# Patient Record
Sex: Female | Born: 1980 | Race: White | Hispanic: No | Marital: Single | Smoking: Current every day smoker
Health system: Southern US, Community
[De-identification: ages and names within clinical notes are randomized; demographics above are authoritative.]

## PROBLEM LIST (undated history)

## (undated) DIAGNOSIS — K2100 Gastro-esophageal reflux disease with esophagitis, without bleeding: Secondary | ICD-10-CM

## (undated) DIAGNOSIS — F419 Anxiety disorder, unspecified: Secondary | ICD-10-CM

## (undated) DIAGNOSIS — K429 Umbilical hernia without obstruction or gangrene: Secondary | ICD-10-CM

## (undated) DIAGNOSIS — E119 Type 2 diabetes mellitus without complications: Secondary | ICD-10-CM

## (undated) DIAGNOSIS — M549 Dorsalgia, unspecified: Secondary | ICD-10-CM

## (undated) DIAGNOSIS — G8929 Other chronic pain: Secondary | ICD-10-CM

## (undated) HISTORY — PX: HERNIA REPAIR: SHX51

## (undated) HISTORY — PX: MYRINGOTOMY WITH TUBE PLACEMENT: SHX5663

## (undated) HISTORY — PX: ABDOMINAL HYSTERECTOMY: SHX81

## (undated) HISTORY — PX: TUBAL LIGATION: SHX77

## (undated) HISTORY — PX: BACK SURGERY: SHX140

---

## 2012-10-29 ENCOUNTER — Emergency Department (HOSPITAL_BASED_OUTPATIENT_CLINIC_OR_DEPARTMENT_OTHER)
Admission: EM | Admit: 2012-10-29 | Discharge: 2012-10-29 | Disposition: A | Discharge status: Home / Self Care | Payer: Medicaid Other | Attending: Emergency Medicine | Admitting: Emergency Medicine

## 2012-10-29 ENCOUNTER — Emergency Department (HOSPITAL_BASED_OUTPATIENT_CLINIC_OR_DEPARTMENT_OTHER): Payer: Medicaid Other

## 2012-10-29 ENCOUNTER — Encounter (HOSPITAL_BASED_OUTPATIENT_CLINIC_OR_DEPARTMENT_OTHER): Payer: Self-pay | Admitting: *Deleted

## 2012-10-29 DIAGNOSIS — Z8781 Personal history of (healed) traumatic fracture: Secondary | ICD-10-CM | POA: Insufficient documentation

## 2012-10-29 DIAGNOSIS — M25579 Pain in unspecified ankle and joints of unspecified foot: Secondary | ICD-10-CM | POA: Insufficient documentation

## 2012-10-29 DIAGNOSIS — F17200 Nicotine dependence, unspecified, uncomplicated: Secondary | ICD-10-CM | POA: Insufficient documentation

## 2012-10-29 DIAGNOSIS — M25571 Pain in right ankle and joints of right foot: Secondary | ICD-10-CM

## 2012-10-29 NOTE — ED Notes (Signed)
Patient transported to X-ray 

## 2012-10-29 NOTE — ED Provider Notes (Signed)
   History    CSN: [id number] Arrival date & time 10/29/12  2029  First MD Initiated Contact with Patient 10/29/12 2107     Chief Complaint  Patient presents with  . Ankle Pain   (Consider location/radiation/quality/duration/timing/severity/associated sxs/prior Treatment) HPI  Patient with pain right lateral ankle with history of right ankle fracture two years ago.  Patient without new injury.  Symptoms started today.  She is post partum but denies history of dvt, for leg swelling, chest pain or shortness of breath. The pain is localized over the lateral ankle. It is sharp and increases with palpation or weightbearing. It doesn't radiate slightly anteriorly. She has taken ibuprofen without relief. History reviewed. No pertinent past medical history. History reviewed. No pertinent past surgical history. No family history on file. History  Substance Use Topics  . Smoking status: Current Every Day Smoker -- 0.50 packs/day    Types: Cigarettes  . Smokeless tobacco: Not on file  . Alcohol Use: No   OB History   Grav Para Term Preterm Abortions TAB SAB Ect Mult Living                 Review of Systems  All other systems reviewed and are negative.    Allergies  Review of patient's allergies indicates no known allergies.  Home Medications  No current outpatient prescriptions on file. BP 124/79  Pulse 82  Temp(Src) 98.6 F (37 C) (Oral)  Resp 18  Ht 5\' 1"  (1.549 m)  Wt 162 lb (73.483 kg)  BMI 30.63 kg/m2  SpO2 97% Physical Exam  Nursing note and vitals reviewed. Constitutional: She appears well-developed and well-nourished.  HENT:  Head: Normocephalic and atraumatic.  Eyes: Pupils are equal, round, and reactive to light.  Neck: Normal range of motion.  Musculoskeletal:       Feet:    ED Course  Procedures (including critical care time) Labs Reviewed - No data to display Dg Ankle Complete Right  10/29/2012   *RADIOLOGY REPORT*  Clinical Data: Acute onset right  ankle pain.  RIGHT ANKLE - COMPLETE 3+ VIEW  Comparison: None.  Findings: Imaged bones, joints and soft tissues appear normal.  IMPRESSION: Negative exam.   Original Report Authenticated By: Holley Dexter, M.D.   No diagnosis found.  MDM  X-rays reviewed and no acute abnormality noted. I feel this is likely musculoskeletal as she is tender directly over her lateral malleolus. Radiographs reviewed reveal probable old healing fracture. She is placed in an Ace wrap and advised to ice and elevate and use crutches. She is given referral to followup with Dr. Pearletha Forge.  Hilario Quarry, MD 10/29/12 2125

## 2012-10-29 NOTE — ED Notes (Signed)
D/c home- no new rx given- ice pack provided for home use

## 2012-10-29 NOTE — ED Notes (Signed)
Right ankle swelling and pain. No known injury.

## 2012-11-08 ENCOUNTER — Emergency Department (HOSPITAL_BASED_OUTPATIENT_CLINIC_OR_DEPARTMENT_OTHER)
Admission: EM | Admit: 2012-11-08 | Discharge: 2012-11-08 | Disposition: A | Discharge status: Home / Self Care | Payer: PRIVATE HEALTH INSURANCE | Attending: Emergency Medicine | Admitting: Emergency Medicine

## 2012-11-08 ENCOUNTER — Encounter (HOSPITAL_BASED_OUTPATIENT_CLINIC_OR_DEPARTMENT_OTHER): Payer: Self-pay | Admitting: Emergency Medicine

## 2012-11-08 DIAGNOSIS — M5380 Other specified dorsopathies, site unspecified: Secondary | ICD-10-CM | POA: Insufficient documentation

## 2012-11-08 DIAGNOSIS — M62830 Muscle spasm of back: Secondary | ICD-10-CM

## 2012-11-08 DIAGNOSIS — F17200 Nicotine dependence, unspecified, uncomplicated: Secondary | ICD-10-CM | POA: Insufficient documentation

## 2012-11-08 DIAGNOSIS — M546 Pain in thoracic spine: Secondary | ICD-10-CM | POA: Insufficient documentation

## 2012-11-08 HISTORY — DX: Dorsalgia, unspecified: M54.9

## 2012-11-08 MED ORDER — TRAMADOL HCL 50 MG PO TABS: 50.0000 mg | ORAL_TABLET | Freq: Four times a day (QID) | ORAL | Status: DC | PRN

## 2012-11-08 MED ORDER — METHOCARBAMOL 500 MG PO TABS: 500.0000 mg | ORAL_TABLET | Freq: Two times a day (BID) | ORAL | Status: DC

## 2012-11-08 NOTE — ED Provider Notes (Signed)
   History    CSN: [id number] Arrival date & time 11/08/12  1354  First MD Initiated Contact with Patient 11/08/12 1435     Chief Complaint  Patient presents with  . Back Pain   (Consider location/radiation/quality/duration/timing/severity/associated sxs/prior Treatment) Patient is a 32 y.o. female presenting with back pain. The history is provided by the patient. No language interpreter was used.  Back Pain Location:  Lumbar spine and thoracic spine Quality:  Aching Pain severity:  Moderate Pain is:  Same all the time Onset quality:  Gradual Timing:  Constant Progression:  Worsening Chronicity:  Recurrent Relieved by:  Nothing Worsened by:  Nothing tried Pt reports she has had back pain since baby was born [birth year] .  Pt thinks she is having spasms.    Past Medical History  Diagnosis Date  . Back pain    History reviewed. No pertinent past surgical history. History reviewed. No pertinent family history. History  Substance Use Topics  . Smoking status: Current Every Day Smoker -- 0.50 packs/day    Types: Cigarettes  . Smokeless tobacco: Not on file  . Alcohol Use: No   OB History   Grav Para Term Preterm Abortions TAB SAB Ect Mult Living                 Review of Systems  Musculoskeletal: Positive for back pain.  All other systems reviewed and are negative.    Allergies  Review of patient's allergies indicates no known allergies.  Home Medications   Current Outpatient Rx  Name  Route  Sig  Dispense  Refill  . IBUPROFEN PO   Oral   Take by mouth.         . naproxen sodium (ANAPROX) 220 MG tablet   Oral   Take 220 mg by mouth as needed.          BP 128/67  Pulse 75  Temp(Src) 97.9 F (36.6 C) (Oral)  Resp 16  SpO2 99% Physical Exam  Nursing note and vitals reviewed. Constitutional: She appears well-developed and well-nourished.  HENT:  Head: Normocephalic and atraumatic.  Eyes: Conjunctivae and EOM are normal. Pupils are equal, round, and  reactive to light.  Neck: Normal range of motion. Neck supple.  Cardiovascular: Normal rate.   Pulmonary/Chest: Effort normal.  Abdominal: Soft.  Musculoskeletal:  Tender lower thoracic and lumbar spine  Neurological: She is alert.  Skin: Skin is warm.  Psychiatric: She has a normal mood and affect.    ED Course  Procedures (including critical care time) Labs Reviewed - No data to display No results found. 1. Muscle spasm of back     MDM  Pt referred to Dr. Pearletha Forge,  rx for ultram and robaxin.   I advised exercises  Elson Areas, PA-C 11/08/12 1504

## 2012-11-08 NOTE — ED Notes (Signed)
Pt c/o lower back pain since giving birth on 09/22/12; progressively getting worse

## 2012-11-08 NOTE — ED Provider Notes (Signed)
Medical screening examination/treatment/procedure(s) were performed by non-physician practitioner and as supervising physician I was immediately available for consultation/collaboration.   Rolan Bucco, MD 11/08/12 1520

## 2013-01-31 ENCOUNTER — Emergency Department (HOSPITAL_BASED_OUTPATIENT_CLINIC_OR_DEPARTMENT_OTHER)
Admission: EM | Admit: 2013-01-31 | Discharge: 2013-01-31 | Disposition: A | Discharge status: Home / Self Care | Payer: PRIVATE HEALTH INSURANCE | Attending: Emergency Medicine | Admitting: Emergency Medicine

## 2013-01-31 ENCOUNTER — Emergency Department (HOSPITAL_BASED_OUTPATIENT_CLINIC_OR_DEPARTMENT_OTHER): Payer: PRIVATE HEALTH INSURANCE

## 2013-01-31 ENCOUNTER — Encounter (HOSPITAL_BASED_OUTPATIENT_CLINIC_OR_DEPARTMENT_OTHER): Payer: Self-pay | Admitting: *Deleted

## 2013-01-31 DIAGNOSIS — F411 Generalized anxiety disorder: Secondary | ICD-10-CM | POA: Insufficient documentation

## 2013-01-31 DIAGNOSIS — Z79899 Other long term (current) drug therapy: Secondary | ICD-10-CM | POA: Insufficient documentation

## 2013-01-31 DIAGNOSIS — F17200 Nicotine dependence, unspecified, uncomplicated: Secondary | ICD-10-CM | POA: Insufficient documentation

## 2013-01-31 DIAGNOSIS — R110 Nausea: Secondary | ICD-10-CM | POA: Insufficient documentation

## 2013-01-31 DIAGNOSIS — R51 Headache: Secondary | ICD-10-CM | POA: Insufficient documentation

## 2013-01-31 DIAGNOSIS — R42 Dizziness and giddiness: Secondary | ICD-10-CM | POA: Insufficient documentation

## 2013-01-31 DIAGNOSIS — Z3202 Encounter for pregnancy test, result negative: Secondary | ICD-10-CM | POA: Insufficient documentation

## 2013-01-31 LAB — COMPREHENSIVE METABOLIC PANEL
AST: 14 U/L (ref 0–37)
Albumin: 4.1 g/dL (ref 3.5–5.2)
Calcium: 9.7 mg/dL (ref 8.4–10.5)
Creatinine, Ser: 0.7 mg/dL (ref 0.50–1.10)
GFR calc non Af Amer: >90 mL/min (ref >90)
Total Protein: 7.7 g/dL (ref 6.0–8.3)

## 2013-01-31 LAB — URINALYSIS, ROUTINE W REFLEX MICROSCOPIC
Bilirubin Urine: NEGATIVE
Hgb urine dipstick: NEGATIVE
Nitrite: NEGATIVE
Protein, ur: NEGATIVE mg/dL
Urobilinogen, UA: 0.2 mg/dL (ref 0.0–1.0)

## 2013-01-31 LAB — CBC WITH DIFFERENTIAL/PLATELET
Basophils Absolute: 0 10*3/uL (ref 0.0–0.1)
Basophils Relative: 0 % (ref 0–1)
Eosinophils Absolute: 0.1 10*3/uL (ref 0.0–0.7)
Eosinophils Relative: 1 % (ref 0–5)
HCT: 39.7 % (ref 36.0–46.0)
MCHC: 34 g/dL (ref 30.0–36.0)
MCV: 87.8 fL (ref 78.0–100.0)
Monocytes Absolute: 0.4 10*3/uL (ref 0.1–1.0)
Platelets: 241 10*3/uL (ref 150–400)
RDW: 13.9 % (ref 11.5–15.5)

## 2013-01-31 MED ORDER — ONDANSETRON HCL 4 MG/2ML IJ SOLN
4.0000 mg | Freq: Once | INTRAMUSCULAR | Status: AC
  Administered 2013-01-31: 4 mg via INTRAVENOUS
  Filled 2013-01-31: qty 2

## 2013-01-31 MED ORDER — MECLIZINE HCL 25 MG PO TABS
25.0000 mg | ORAL_TABLET | Freq: Once | ORAL | Status: AC
  Administered 2013-01-31: 25 mg via ORAL
  Filled 2013-01-31: qty 1

## 2013-01-31 MED ORDER — SODIUM CHLORIDE 0.9 % IV BOLUS (SEPSIS)
1000.0000 mL | Freq: Once | INTRAVENOUS | Status: AC
  Administered 2013-01-31: 1000 mL via INTRAVENOUS

## 2013-01-31 MED ORDER — MECLIZINE HCL 12.5 MG PO TABS: 12.5000 mg | ORAL_TABLET | Freq: Three times a day (TID) | ORAL | Status: DC | PRN

## 2013-01-31 MED ORDER — DIAZEPAM 2 MG PO TABS: 2.0000 mg | ORAL_TABLET | Freq: Three times a day (TID) | ORAL | Status: DC | PRN

## 2013-01-31 MED ORDER — ONDANSETRON HCL 4 MG PO TABS: 4.0000 mg | ORAL_TABLET | Freq: Four times a day (QID) | ORAL | Status: DC

## 2013-01-31 MED ORDER — LORAZEPAM 1 MG PO TABS
1.0000 mg | ORAL_TABLET | Freq: Once | ORAL | Status: AC
  Administered 2013-01-31: 1 mg via ORAL
  Filled 2013-01-31: qty 1

## 2013-01-31 NOTE — ED Notes (Signed)
Pt ambulated around department without difficulty. No complications noted. Pt states "I still feel a little dizzy but much better."

## 2013-01-31 NOTE — ED Provider Notes (Signed)
CSN: [id number]     Arrival date & time 01/31/13  1455 History  This chart was scribed for Glynn Octave, MD by Ardelia Mems, ED Scribe. This patient was seen in room MH10/MH10 and the patient's care was started at 3:11 PM.  Chief Complaint  Patient presents with  . Dizziness    The history is provided by the patient. No language interpreter was used.    HPI Comments: Maria French is a 32 y.o. female who presents to the Emergency Department complaining of intemittent dizziness over the past 4 months which worsened 3 days ago. She states that she has been having intermittent "dizzy spells" since she gave birth about 4 months ago. She describes her dizziness as a room spinning sensation. She states that her episodes of dizziness are normally only minutes, but she states that today episodes have been much longer, and that her dizziness is almost constant. She reports associated nausea and a generalized headache. She also states that she was recently diagnosed with anxiety after having her child, and she states that she was at first prescribed Zoloft, but is no longer taking this medication because she believed it made her anxiety worse. She states that she believes her dizziness over the past 4 months is caused by anxiety due to the birth of her child, and she has been taking Meclizine some days with mild relief of her dizziness. She states that she has not taken Meclizine today. She states that she is not taking birth control, and reports a history of tubal ligation. She states that she has no allergies to medications. She denies blurred vision, emesis, abdominal pain, chest pain or any other symptoms.   Past Medical History  Diagnosis Date  . Back pain    Past Surgical History  Procedure Laterality Date  . Tubal ligation     History reviewed. No pertinent family history. History  Substance Use Topics  . Smoking status: Current Every Day Smoker -- 0.50 packs/day    Types: Cigarettes  .  Smokeless tobacco: Not on file  . Alcohol Use: No   OB History   Grav Para Term Preterm Abortions TAB SAB Ect Mult Living                 Review of Systems A complete 10 system review of systems was obtained and all systems are negative except as noted in the HPI and PMH.   Allergies  Review of patient's allergies indicates no known allergies.  Home Medications   Current Outpatient Rx  Name  Route  Sig  Dispense  Refill  . diazepam (VALIUM) 2 MG tablet   Oral   Take 1 tablet (2 mg total) by mouth every 8 (eight) hours as needed (dizziness).   10 tablet   0   . IBUPROFEN PO   Oral   Take by mouth.         . meclizine (ANTIVERT) 12.5 MG tablet   Oral   Take 1 tablet (12.5 mg total) by mouth 3 (three) times daily as needed.   30 tablet   0   . methocarbamol (ROBAXIN) 500 MG tablet   Oral   Take 1 tablet (500 mg total) by mouth 2 (two) times daily.   20 tablet   0   . naproxen sodium (ANAPROX) 220 MG tablet   Oral   Take 220 mg by mouth as needed.         . ondansetron (ZOFRAN) 4 MG tablet  Oral   Take 1 tablet (4 mg total) by mouth every 6 (six) hours.   12 tablet   0   . traMADol (ULTRAM) 50 MG tablet   Oral   Take 1 tablet (50 mg total) by mouth every 6 (six) hours as needed for pain.   20 tablet   0    Triage Vitals: BP 130/81  Pulse 77  Temp(Src) 98.4 F (36.9 C) (Oral)  Resp 16  Ht 5\' 1"  (1.549 m)  Wt 172 lb (78.019 kg)  BMI 32.52 kg/m2  SpO2 99%  Physical Exam  Nursing note and vitals reviewed. Constitutional: She is oriented to person, place, and time. She appears well-developed and well-nourished. No distress.  HENT:  Head: Normocephalic and atraumatic.  Mouth/Throat: Oropharynx is clear and moist. No oropharyngeal exudate.  Eyes: EOM are normal. Pupils are equal, round, and reactive to light.  Neck: Normal range of motion. Neck supple. No tracheal deviation present.  Cardiovascular: Normal rate.   No murmur  heard. Pulmonary/Chest: Effort normal. No respiratory distress.  Abdominal: Soft. There is no tenderness. There is no rebound and no guarding.  Musculoskeletal: Normal range of motion. She exhibits no edema and no tenderness.  Neurological: She is alert and oriented to person, place, and time. No cranial nerve deficit. She exhibits normal muscle tone. Coordination normal.  CN 2-12 intact, no ataxia on finger to nose, no nystagmus, 5/5 strength throughout, no pronator drift, Romberg positive, normal gait.  Test of skew negative. Head impulse testing negative.  Skin: Skin is warm and dry.  Psychiatric: She has a normal mood and affect. Her behavior is normal.    ED Course  Procedures (including critical care time)  DIAGNOSTIC STUDIES: Oxygen Saturation is 99% on RA, normal by my interpretation.    COORDINATION OF CARE: 3:17 PM- Discussed plan for pt to receive IV fluids, Zofran, Ativan and Antivert in the ED. Also discussed plan to obtain diagnostic lab work and a CT of pt's head. Discussed that a stroke or more serious causes of pt's dizziness are not suspected. Pt advised of plan for treatment and pt agrees.  4:27 PM- Recheck with pt and pt states that she is feeling better, with her dizziness mostly subsided, after receiving Ativan and Antivert in the ED. Discussed suspicion of positional vertigo. Discussed plan for pt to attempt drinking fluids and ambulating in the ED. Discussed plan for discharge with a prescription for some Ativan and pt agrees.  Medications  sodium chloride 0.9 % bolus 1,000 mL (0 mLs Intravenous Stopped 01/31/13 1701)  ondansetron (ZOFRAN) injection 4 mg (4 mg Intravenous Given 01/31/13 1538)  meclizine (ANTIVERT) tablet 25 mg (25 mg Oral Given 01/31/13 1538)  LORazepam (ATIVAN) tablet 1 mg (1 mg Oral Given 01/31/13 1538)   Labs Review Labs Reviewed  COMPREHENSIVE METABOLIC PANEL - Abnormal; Notable for the following:    Total Bilirubin 0.2 (*)    All other  components within normal limits  URINALYSIS, ROUTINE W REFLEX MICROSCOPIC  PREGNANCY, URINE  CBC WITH DIFFERENTIAL   Imaging Review Ct Head Wo Contrast  01/31/2013   CLINICAL DATA:  Dizziness and headache  EXAM: CT HEAD WITHOUT CONTRAST  TECHNIQUE: Contiguous axial images were obtained from the base of the skull through the vertex without intravenous contrast.  COMPARISON:  None.  FINDINGS: The bony calvarium is intact. The ventricles are of normal size and configuration. No findings to suggest acute hemorrhage, acute infarction or space-occupying mass lesion are noted. Marland Kitchen  IMPRESSION:  No acute abnormality is identified.   Electronically Signed   By: Alcide Clever M.D.   On: 01/31/2013 15:58    MDM   1. Vertigo    Intermittent vertigo since giving birth 4 months ago, worse today. PCP attributes to anxiety. No focal weakness, numbness or tingling.  Romberg positive, otherwise neurologically intact. Normal gait. No nystagmus. Negative head impulse testing and test of skew. CT head negative. Labs unremarkable. Symptoms improved with Ativan and meclizine. Tolerating by mouth and ambulatory. Do not suspect CNS pathology. She appears stable for outpatient followup with her doctor and neurology.   Date: 01/31/2013  Rate: 74  Rhythm: normal sinus rhythm  QRS Axis: normal  Intervals: normal  ST/T Wave abnormalities: normal  Conduction Disutrbances:none  Narrative Interpretation:   Old EKG Reviewed: none available    I personally performed the services described in this documentation, which was scribed in my presence. The recorded information has been reviewed and is accurate.           Glynn Octave, MD 01/31/13 [phone]

## 2013-01-31 NOTE — ED Notes (Signed)
Pt c/o dizziness x 3 days, already taking antivert

## 2013-01-31 NOTE — ED Notes (Signed)
MD at bedside. 

## 2013-03-22 ENCOUNTER — Other Ambulatory Visit: Payer: Self-pay | Admitting: Neurosurgery

## 2013-03-22 DIAGNOSIS — M545 Low back pain: Secondary | ICD-10-CM

## 2013-03-22 DIAGNOSIS — M5430 Sciatica, unspecified side: Secondary | ICD-10-CM

## 2013-04-02 ENCOUNTER — Ambulatory Visit
Admission: RE | Admit: 2013-04-02 | Discharge: 2013-04-02 | Disposition: A | Discharge status: Home / Self Care | Payer: PRIVATE HEALTH INSURANCE | Source: Ambulatory Visit | Attending: Neurosurgery | Admitting: Neurosurgery

## 2013-04-02 VITALS — BP 106/71 | HR 82

## 2013-04-02 DIAGNOSIS — M545 Low back pain, unspecified: Secondary | ICD-10-CM

## 2013-04-02 DIAGNOSIS — M5430 Sciatica, unspecified side: Secondary | ICD-10-CM

## 2013-04-02 MED ORDER — HYDROMORPHONE HCL PF 1 MG/ML IJ SOLN
1.0000 mg | Freq: Once | INTRAMUSCULAR | Status: AC
  Administered 2013-04-02: 1 mg via INTRAMUSCULAR

## 2013-04-02 MED ORDER — IOHEXOL 180 MG/ML  SOLN
18.0000 mL | Freq: Once | INTRAMUSCULAR | Status: AC | PRN
  Administered 2013-04-02: 18 mL via INTRATHECAL

## 2013-04-02 MED ORDER — ONDANSETRON HCL 4 MG/2ML IJ SOLN
4.0000 mg | Freq: Once | INTRAMUSCULAR | Status: AC
  Administered 2013-04-02: 4 mg via INTRAMUSCULAR

## 2013-04-02 NOTE — Progress Notes (Signed)
Patient states last dose of Tramadol was two days ago.  jkl

## 2013-05-26 ENCOUNTER — Encounter (HOSPITAL_COMMUNITY): Payer: Self-pay | Admitting: Pharmacy Technician

## 2013-05-26 ENCOUNTER — Other Ambulatory Visit: Payer: Self-pay | Admitting: Neurosurgery

## 2013-05-31 ENCOUNTER — Inpatient Hospital Stay (HOSPITAL_COMMUNITY): Admission: RE | Admit: 2013-05-31 | Payer: PRIVATE HEALTH INSURANCE | Source: Ambulatory Visit | Admitting: Neurosurgery

## 2013-05-31 ENCOUNTER — Encounter (HOSPITAL_COMMUNITY): Admission: RE | Payer: Self-pay | Source: Ambulatory Visit

## 2013-05-31 SURGERY — POSTERIOR LUMBAR FUSION 1 LEVEL
Anesthesia: General | Site: Back

## 2013-06-03 ENCOUNTER — Ambulatory Visit (HOSPITAL_COMMUNITY)
Admission: RE | Admit: 2013-06-03 | Discharge: 2013-06-03 | Disposition: A | Discharge status: Home / Self Care | Payer: BC Managed Care – PPO | Source: Ambulatory Visit | Attending: Neurosurgery | Admitting: Neurosurgery

## 2013-06-03 ENCOUNTER — Encounter (HOSPITAL_COMMUNITY): Payer: Self-pay

## 2013-06-03 ENCOUNTER — Encounter (HOSPITAL_COMMUNITY)
Admission: RE | Admit: 2013-06-03 | Discharge: 2013-06-03 | Disposition: A | Discharge status: Home / Self Care | Payer: BC Managed Care – PPO | Source: Ambulatory Visit | Attending: Neurosurgery | Admitting: Neurosurgery

## 2013-06-03 DIAGNOSIS — Z01812 Encounter for preprocedural laboratory examination: Secondary | ICD-10-CM | POA: Insufficient documentation

## 2013-06-03 DIAGNOSIS — M47817 Spondylosis without myelopathy or radiculopathy, lumbosacral region: Secondary | ICD-10-CM | POA: Insufficient documentation

## 2013-06-03 DIAGNOSIS — G4730 Sleep apnea, unspecified: Secondary | ICD-10-CM | POA: Insufficient documentation

## 2013-06-03 DIAGNOSIS — Z87891 Personal history of nicotine dependence: Secondary | ICD-10-CM | POA: Insufficient documentation

## 2013-06-03 LAB — BASIC METABOLIC PANEL
BUN: 12 mg/dL (ref 6–23)
CHLORIDE: 101 meq/L (ref 96–112)
CO2: 26 meq/L (ref 19–32)
Calcium: 8.9 mg/dL (ref 8.4–10.5)
Creatinine, Ser: 0.63 mg/dL (ref 0.50–1.10)
GFR calc Af Amer: >90 mL/min (ref >90)
GLUCOSE: 104 mg/dL — AB (ref 70–99)
POTASSIUM: 3.7 meq/L (ref 3.7–5.3)
Sodium: 140 mEq/L (ref 137–147)

## 2013-06-03 LAB — CBC
HEMATOCRIT: 39 % (ref 36.0–46.0)
HEMOGLOBIN: 13.8 g/dL (ref 12.0–15.0)
MCH: 31.4 pg (ref 26.0–34.0)
MCHC: 35.4 g/dL (ref 30.0–36.0)
MCV: 88.6 fL (ref 78.0–100.0)
Platelets: 211 10*3/uL (ref 150–400)
RBC: 4.4 MIL/uL (ref 3.87–5.11)
RDW: 13.2 % (ref 11.5–15.5)
WBC: 7.6 10*3/uL (ref 4.0–10.5)

## 2013-06-03 LAB — SURGICAL PCR SCREEN
MRSA, PCR: NEGATIVE
Staphylococcus aureus: POSITIVE — AB

## 2013-06-03 LAB — TYPE AND SCREEN
ABO/RH(D): O POS
Antibody Screen: NEGATIVE

## 2013-06-03 LAB — ABO/RH: ABO/RH(D): O POS

## 2013-06-03 LAB — HCG, SERUM, QUALITATIVE: Preg, Serum: NEGATIVE

## 2013-06-03 NOTE — Pre-Procedure Instructions (Signed)
Maria French  06/03/2013   Your procedure is scheduled on:  Wednesday February 11 th at 1025 AM  Report to [facility] Short Stay Main Entrance "A"at 0825 AM.  Call this number if you have problems the morning of surgery: [phone]   Remember:   Do not eat food or drink liquids after midnight.   Take these medicines the morning of surgery with A SIP OF WATER: Klonopin, Pain med if needed for pain.  Stop Aspirin, Vitamins, herbal medication, and NSAIDs (ibuprofen, Advil, Aleve and Naproxen)  Do not wear jewelry, make-up or nail polish.  Do not wear lotions, powders, or perfumes. You may wear deodorant.  Do not shave 48 hours prior to surgery.  Do not bring valuables to the hospital.  [facility] is not responsible  For any belongings or valuables.                               Contacts, dentures or bridgework may not be worn into surgery.  Leave suitcase in the car. After surgery it may be brought to your room.  For patients admitted to the hospital, discharge time is determined by your treatment team.               Patients discharged the day of surgery will not be allowed to drive  home.    Special Instructions: [place] - Preparing for Surgery  Before surgery, you can play an important role.  Because skin is not sterile, your skin needs to be as free of germs as possible.  You can reduce the number of germs on you skin by washing with CHG (chlorahexidine gluconate) soap before surgery.  CHG is an antiseptic cleaner which kills germs and bonds with the skin to continue killing germs even after washing.  Please DO NOT use if you have an allergy to CHG or antibacterial soaps.  If your skin becomes reddened/irritated stop using the CHG and inform your nurse when you arrive at Short Stay.  Do not shave (including legs and underarms) for at least 48 hours prior to the first CHG shower.  You may shave your face.  Please follow these instructions carefully:   1.  Shower with CHG  Soap the night before surgery and the  morning of Surgery.  2.  If you choose to wash your hair, wash your hair first as usual with your normal shampoo.  3.  After you shampoo, rinse your hair and body thoroughly to remove the  Shampoo.  4.  Use CHG as you would any other liquid soap.  You can apply chg directly  to the skin and wash gently with scrungie or a clean washcloth.  5.  Apply the CHG Soap to your body ONLY FROM THE NECK DOWN.  Do not use on open wounds or open sores.  Avoid contact with your eyes,       ears, mouth and genitals (private parts).  Wash genitals (private parts) with your normal soap.  6.  Wash thoroughly, paying special attention to the area where your surgery   will be performed.  7.  Thoroughly rinse your body with warm water from the neck down.  8.  DO NOT shower/wash with your normal soap after using and rinsing off the CHG Soap.  9.  Pat yourself dry with a clean towel.            10.  Wear clean  pajamas.            11.  Place clean sheets on your bed the night of your first shower and do not sleep with pets.  Day of Surgery  Do not apply any lotions/deoderants the morning of surgery.  Please wear clean clothes to the hospital/surgery center.      Please read over the following fact sheets that you were given: Pain Booklet, Coughing and Deep Breathing, Blood Transfusion Information, MRSA Information and Surgical Site Infection Prevention

## 2013-06-07 NOTE — Progress Notes (Signed)
Call placed to [facility] in Medical records at [place] to request sleep study report, no answer when call transferred to medical records.

## 2013-06-08 MED ORDER — DEXAMETHASONE SODIUM PHOSPHATE 10 MG/ML IJ SOLN
10.0000 mg | INTRAMUSCULAR | Status: AC
  Administered 2013-06-09: 10 mg via INTRAVENOUS
  Filled 2013-06-08: qty 1

## 2013-06-08 MED ORDER — CEFAZOLIN SODIUM-DEXTROSE 2-3 GM-% IV SOLR
2.0000 g | INTRAVENOUS | Status: AC
  Administered 2013-06-09: 2 g via INTRAVENOUS

## 2013-06-08 NOTE — Progress Notes (Signed)
Pt notified of time change;to arrive at [facility]

## 2013-06-09 ENCOUNTER — Encounter (HOSPITAL_COMMUNITY): Payer: BC Managed Care – PPO | Admitting: Anesthesiology

## 2013-06-09 ENCOUNTER — Encounter (HOSPITAL_COMMUNITY)
Admission: RE | Disposition: A | Discharge status: Home / Self Care | Payer: Self-pay | Source: Ambulatory Visit | Attending: Neurosurgery

## 2013-06-09 ENCOUNTER — Encounter (HOSPITAL_COMMUNITY): Payer: Self-pay | Admitting: *Deleted

## 2013-06-09 ENCOUNTER — Inpatient Hospital Stay (HOSPITAL_COMMUNITY): Payer: BC Managed Care – PPO

## 2013-06-09 ENCOUNTER — Inpatient Hospital Stay (HOSPITAL_COMMUNITY)
Admission: RE | Admit: 2013-06-09 | Discharge: 2013-06-11 | DRG: 460 | Disposition: A | Discharge status: Home / Self Care | Payer: BC Managed Care – PPO | Source: Ambulatory Visit | Attending: Neurosurgery | Admitting: Neurosurgery

## 2013-06-09 ENCOUNTER — Inpatient Hospital Stay (HOSPITAL_COMMUNITY): Payer: BC Managed Care – PPO | Admitting: Anesthesiology

## 2013-06-09 DIAGNOSIS — M5137 Other intervertebral disc degeneration, lumbosacral region: Principal | ICD-10-CM | POA: Diagnosis present

## 2013-06-09 DIAGNOSIS — M47817 Spondylosis without myelopathy or radiculopathy, lumbosacral region: Secondary | ICD-10-CM | POA: Diagnosis present

## 2013-06-09 DIAGNOSIS — M48061 Spinal stenosis, lumbar region without neurogenic claudication: Secondary | ICD-10-CM | POA: Diagnosis present

## 2013-06-09 DIAGNOSIS — F17200 Nicotine dependence, unspecified, uncomplicated: Secondary | ICD-10-CM | POA: Diagnosis present

## 2013-06-09 DIAGNOSIS — M7130 Other bursal cyst, unspecified site: Secondary | ICD-10-CM | POA: Diagnosis present

## 2013-06-09 DIAGNOSIS — M51379 Other intervertebral disc degeneration, lumbosacral region without mention of lumbar back pain or lower extremity pain: Principal | ICD-10-CM | POA: Diagnosis present

## 2013-06-09 LAB — GLUCOSE, CAPILLARY: GLUCOSE-CAPILLARY: 235 mg/dL — AB (ref 70–99)

## 2013-06-09 SURGERY — POSTERIOR LUMBAR FUSION 1 LEVEL
Anesthesia: General | Site: Back

## 2013-06-09 MED ORDER — ONDANSETRON HCL 4 MG/2ML IJ SOLN: 4.0000 mg | Freq: Once | INTRAMUSCULAR | Status: DC | PRN

## 2013-06-09 MED ORDER — SODIUM CHLORIDE 0.9 % IJ SOLN: 3.0000 mL | INTRAMUSCULAR | Status: DC | PRN

## 2013-06-09 MED ORDER — LACTATED RINGERS IV SOLN
INTRAVENOUS | Status: DC | PRN
  Administered 2013-06-09 (×3): via INTRAVENOUS

## 2013-06-09 MED ORDER — HYDROMORPHONE HCL PF 1 MG/ML IJ SOLN
INTRAMUSCULAR | Status: AC
  Filled 2013-06-09: qty 1

## 2013-06-09 MED ORDER — FENTANYL CITRATE 0.05 MG/ML IJ SOLN
INTRAMUSCULAR | Status: DC | PRN
  Administered 2013-06-09: 100 ug via INTRAVENOUS
  Administered 2013-06-09: 50 ug via INTRAVENOUS

## 2013-06-09 MED ORDER — ACETAMINOPHEN 650 MG RE SUPP: 650.0000 mg | RECTAL | Status: DC | PRN

## 2013-06-09 MED ORDER — LIDOCAINE HCL (CARDIAC) 20 MG/ML IV SOLN
INTRAVENOUS | Status: DC | PRN
  Administered 2013-06-09: 80 mg via INTRAVENOUS

## 2013-06-09 MED ORDER — DOCUSATE SODIUM 100 MG PO CAPS
100.0000 mg | ORAL_CAPSULE | Freq: Two times a day (BID) | ORAL | Status: DC
  Administered 2013-06-09 – 2013-06-11 (×4): 100 mg via ORAL
  Filled 2013-06-09 (×4): qty 1

## 2013-06-09 MED ORDER — CYCLOBENZAPRINE HCL 10 MG PO TABS
10.0000 mg | ORAL_TABLET | Freq: Three times a day (TID) | ORAL | Status: DC | PRN
  Administered 2013-06-09 – 2013-06-10 (×5): 10 mg via ORAL
  Filled 2013-06-09 (×4): qty 1

## 2013-06-09 MED ORDER — NEOSTIGMINE METHYLSULFATE 1 MG/ML IJ SOLN
INTRAMUSCULAR | Status: DC | PRN
  Administered 2013-06-09: 3 mg via INTRAVENOUS

## 2013-06-09 MED ORDER — LIDOCAINE HCL (CARDIAC) 20 MG/ML IV SOLN
INTRAVENOUS | Status: AC
  Filled 2013-06-09: qty 5

## 2013-06-09 MED ORDER — NEOSTIGMINE METHYLSULFATE 1 MG/ML IJ SOLN
INTRAMUSCULAR | Status: AC
  Filled 2013-06-09: qty 10

## 2013-06-09 MED ORDER — ALUM & MAG HYDROXIDE-SIMETH 200-200-20 MG/5ML PO SUSP
30.0000 mL | Freq: Four times a day (QID) | ORAL | Status: DC | PRN
  Filled 2013-06-09: qty 30

## 2013-06-09 MED ORDER — CYCLOBENZAPRINE HCL 10 MG PO TABS
ORAL_TABLET | ORAL | Status: AC
  Filled 2013-06-09: qty 1

## 2013-06-09 MED ORDER — OXYCODONE HCL 5 MG PO TABS
ORAL_TABLET | ORAL | Status: AC
  Filled 2013-06-09: qty 1

## 2013-06-09 MED ORDER — PROPOFOL 10 MG/ML IV BOLUS
INTRAVENOUS | Status: AC
  Filled 2013-06-09: qty 20

## 2013-06-09 MED ORDER — OXYCODONE HCL 5 MG PO TABS
5.0000 mg | ORAL_TABLET | Freq: Once | ORAL | Status: AC | PRN
  Administered 2013-06-09: 5 mg via ORAL

## 2013-06-09 MED ORDER — ACETAMINOPHEN 325 MG PO TABS: 650.0000 mg | ORAL_TABLET | ORAL | Status: DC | PRN

## 2013-06-09 MED ORDER — ROCURONIUM BROMIDE 50 MG/5ML IV SOLN
INTRAVENOUS | Status: AC
  Filled 2013-06-09: qty 1

## 2013-06-09 MED ORDER — SODIUM CHLORIDE 0.9 % IR SOLN
Status: DC | PRN
  Administered 2013-06-09: 08:00:00

## 2013-06-09 MED ORDER — BUPIVACAINE HCL (PF) 0.25 % IJ SOLN
INTRAMUSCULAR | Status: DC | PRN
  Administered 2013-06-09: 10 mL

## 2013-06-09 MED ORDER — SODIUM CHLORIDE 0.9 % IJ SOLN
3.0000 mL | Freq: Two times a day (BID) | INTRAMUSCULAR | Status: DC
  Administered 2013-06-10 (×2): 3 mL via INTRAVENOUS

## 2013-06-09 MED ORDER — THROMBIN 20000 UNITS EX SOLR
CUTANEOUS | Status: DC | PRN
  Administered 2013-06-09: 08:00:00 via TOPICAL

## 2013-06-09 MED ORDER — PHENOL 1.4 % MT LIQD: 1.0000 | OROMUCOSAL | Status: DC | PRN

## 2013-06-09 MED ORDER — OXYCODONE HCL 5 MG/5ML PO SOLN: 5.0000 mg | Freq: Once | ORAL | Status: AC | PRN

## 2013-06-09 MED ORDER — MIDAZOLAM HCL 5 MG/5ML IJ SOLN
INTRAMUSCULAR | Status: DC | PRN
  Administered 2013-06-09: 2 mg via INTRAVENOUS

## 2013-06-09 MED ORDER — GLYCOPYRROLATE 0.2 MG/ML IJ SOLN
INTRAMUSCULAR | Status: DC | PRN
  Administered 2013-06-09: 0.4 mg via INTRAVENOUS

## 2013-06-09 MED ORDER — ACETAMINOPHEN 325 MG PO TABS: 325.0000 mg | ORAL_TABLET | ORAL | Status: DC | PRN

## 2013-06-09 MED ORDER — HYDROMORPHONE HCL PF 1 MG/ML IJ SOLN
0.5000 mg | INTRAMUSCULAR | Status: DC | PRN
  Administered 2013-06-09 – 2013-06-10 (×2): 1 mg via INTRAVENOUS
  Administered 2013-06-10: 0.5 mg via INTRAVENOUS
  Administered 2013-06-11: 1 mg via INTRAVENOUS
  Filled 2013-06-09 (×4): qty 1

## 2013-06-09 MED ORDER — HYDROMORPHONE HCL PF 1 MG/ML IJ SOLN
0.2500 mg | INTRAMUSCULAR | Status: DC | PRN
  Administered 2013-06-09 (×4): 0.5 mg via INTRAVENOUS

## 2013-06-09 MED ORDER — ONDANSETRON HCL 4 MG/2ML IJ SOLN
INTRAMUSCULAR | Status: DC | PRN
  Administered 2013-06-09: 4 mg via INTRAVENOUS

## 2013-06-09 MED ORDER — ONDANSETRON HCL 4 MG/2ML IJ SOLN
INTRAMUSCULAR | Status: AC
  Filled 2013-06-09: qty 2

## 2013-06-09 MED ORDER — KETOROLAC TROMETHAMINE 30 MG/ML IJ SOLN: 15.0000 mg | Freq: Once | INTRAMUSCULAR | Status: DC | PRN

## 2013-06-09 MED ORDER — OXYCODONE-ACETAMINOPHEN 5-325 MG PO TABS
1.0000 | ORAL_TABLET | ORAL | Status: DC | PRN
  Administered 2013-06-09 – 2013-06-11 (×9): 2 via ORAL
  Filled 2013-06-09 (×9): qty 2

## 2013-06-09 MED ORDER — GLYCOPYRROLATE 0.2 MG/ML IJ SOLN
INTRAMUSCULAR | Status: AC
Start: 2013-06-09 — End: 2013-06-09
  Filled 2013-06-09: qty 1

## 2013-06-09 MED ORDER — ALBUMIN HUMAN 5 % IV SOLN
INTRAVENOUS | Status: DC | PRN
  Administered 2013-06-09: 11:00:00 via INTRAVENOUS

## 2013-06-09 MED ORDER — 0.9 % SODIUM CHLORIDE (POUR BTL) OPTIME
TOPICAL | Status: DC | PRN
  Administered 2013-06-09: 1000 mL

## 2013-06-09 MED ORDER — ARTIFICIAL TEARS OP OINT
TOPICAL_OINTMENT | OPHTHALMIC | Status: DC | PRN
  Administered 2013-06-09: 1 via OPHTHALMIC

## 2013-06-09 MED ORDER — ARTIFICIAL TEARS OP OINT
TOPICAL_OINTMENT | OPHTHALMIC | Status: AC
  Filled 2013-06-09: qty 3.5

## 2013-06-09 MED ORDER — GLYCOPYRROLATE 0.2 MG/ML IJ SOLN
INTRAMUSCULAR | Status: AC
  Filled 2013-06-09: qty 1

## 2013-06-09 MED ORDER — FENTANYL CITRATE 0.05 MG/ML IJ SOLN
INTRAMUSCULAR | Status: AC
  Filled 2013-06-09: qty 5

## 2013-06-09 MED ORDER — LIDOCAINE-EPINEPHRINE 1 %-1:100000 IJ SOLN
INTRAMUSCULAR | Status: DC | PRN
  Administered 2013-06-09: 8 mL

## 2013-06-09 MED ORDER — MIDAZOLAM HCL 2 MG/2ML IJ SOLN
INTRAMUSCULAR | Status: AC
  Filled 2013-06-09: qty 2

## 2013-06-09 MED ORDER — TRAMADOL HCL 50 MG PO TABS
50.0000 mg | ORAL_TABLET | Freq: Four times a day (QID) | ORAL | Status: DC | PRN
  Administered 2013-06-11: 50 mg via ORAL
  Filled 2013-06-09: qty 1

## 2013-06-09 MED ORDER — PROPOFOL 10 MG/ML IV BOLUS
INTRAVENOUS | Status: DC | PRN
  Administered 2013-06-09: 200 mg via INTRAVENOUS

## 2013-06-09 MED ORDER — SODIUM CHLORIDE 0.9 % IV SOLN
250.0000 mL | INTRAVENOUS | Status: DC
  Administered 2013-06-09: 250 mL via INTRAVENOUS

## 2013-06-09 MED ORDER — CEFAZOLIN SODIUM 1-5 GM-% IV SOLN
1.0000 g | Freq: Three times a day (TID) | INTRAVENOUS | Status: AC
  Administered 2013-06-09 – 2013-06-10 (×2): 1 g via INTRAVENOUS
  Filled 2013-06-09 (×2): qty 50

## 2013-06-09 MED ORDER — ONDANSETRON HCL 4 MG/2ML IJ SOLN: 4.0000 mg | INTRAMUSCULAR | Status: DC | PRN

## 2013-06-09 MED ORDER — SODIUM CHLORIDE 0.9 % IJ SOLN
INTRAMUSCULAR | Status: AC
  Filled 2013-06-09: qty 10

## 2013-06-09 MED ORDER — CLONAZEPAM 0.5 MG PO TABS
0.2500 mg | ORAL_TABLET | Freq: Every day | ORAL | Status: DC
  Administered 2013-06-10 – 2013-06-11 (×2): 0.25 mg via ORAL
  Filled 2013-06-09 (×3): qty 1

## 2013-06-09 MED ORDER — EPHEDRINE SULFATE 50 MG/ML IJ SOLN
INTRAMUSCULAR | Status: AC
  Filled 2013-06-09: qty 1

## 2013-06-09 MED ORDER — MENTHOL 3 MG MT LOZG: 1.0000 | LOZENGE | OROMUCOSAL | Status: DC | PRN

## 2013-06-09 MED ORDER — ROCURONIUM BROMIDE 100 MG/10ML IV SOLN
INTRAVENOUS | Status: DC | PRN
  Administered 2013-06-09: 40 mg via INTRAVENOUS
  Administered 2013-06-09 (×2): 10 mg via INTRAVENOUS

## 2013-06-09 MED ORDER — ACETAMINOPHEN 160 MG/5ML PO SOLN
325.0000 mg | ORAL | Status: DC | PRN
Start: 2013-06-09 — End: 2013-06-09
  Filled 2013-06-09: qty 20.3

## 2013-06-09 SURGICAL SUPPLY — 73 items
BAG DECANTER FOR FLEXI CONT (MISCELLANEOUS) ×3 IMPLANT
BENZOIN TINCTURE PRP APPL 2/3 (GAUZE/BANDAGES/DRESSINGS) ×3 IMPLANT
BLADE SURG 11 STRL SS (BLADE) ×3 IMPLANT
BLADE SURG ROTATE 9660 (MISCELLANEOUS) IMPLANT
BRUSH SCRUB EZ PLAIN DRY (MISCELLANEOUS) ×3 IMPLANT
BUR MATCHSTICK NEURO 3.0 LAGG (BURR) ×3 IMPLANT
BUR PRECISION FLUTE 6.0 (BURR) ×3 IMPLANT
CANISTER SUCT 3000ML (MISCELLANEOUS) ×3 IMPLANT
CAP LOCKING THREADED (Cap) ×12 IMPLANT
CLOSURE WOUND 1/2 X4 (GAUZE/BANDAGES/DRESSINGS) ×2
CONT SPEC 4OZ CLIKSEAL STRL BL (MISCELLANEOUS) ×6 IMPLANT
COVER BACK TABLE 24X17X13 BIG (DRAPES) IMPLANT
COVER TABLE BACK 60X90 (DRAPES) ×3 IMPLANT
DECANTER SPIKE VIAL GLASS SM (MISCELLANEOUS) ×3 IMPLANT
DERMABOND ADHESIVE PROPEN (GAUZE/BANDAGES/DRESSINGS) ×2
DERMABOND ADVANCED (GAUZE/BANDAGES/DRESSINGS)
DERMABOND ADVANCED .7 DNX12 (GAUZE/BANDAGES/DRESSINGS) IMPLANT
DERMABOND ADVANCED .7 DNX6 (GAUZE/BANDAGES/DRESSINGS) ×1 IMPLANT
DRAPE C-ARM 42X72 X-RAY (DRAPES) ×6 IMPLANT
DRAPE LAPAROTOMY 100X72X124 (DRAPES) ×3 IMPLANT
DRAPE POUCH INSTRU U-SHP 10X18 (DRAPES) ×3 IMPLANT
DRAPE PROXIMA HALF (DRAPES) IMPLANT
DRAPE SURG 17X23 STRL (DRAPES) ×3 IMPLANT
DRSG OPSITE 4X5.5 SM (GAUZE/BANDAGES/DRESSINGS) ×3 IMPLANT
DRSG OPSITE POSTOP 4X6 (GAUZE/BANDAGES/DRESSINGS) ×3 IMPLANT
DURAPREP 26ML APPLICATOR (WOUND CARE) ×3 IMPLANT
ELECT REM PT RETURN 9FT ADLT (ELECTROSURGICAL) ×3
ELECTRODE REM PT RTRN 9FT ADLT (ELECTROSURGICAL) ×1 IMPLANT
EVACUATOR 1/8 PVC DRAIN (DRAIN) ×3 IMPLANT
EVACUATOR 3/16  PVC DRAIN (DRAIN) ×2
EVACUATOR 3/16 PVC DRAIN (DRAIN) ×1 IMPLANT
GAUZE SPONGE 4X4 16PLY XRAY LF (GAUZE/BANDAGES/DRESSINGS) IMPLANT
GLOVE BIO SURGEON STRL SZ8 (GLOVE) ×9 IMPLANT
GLOVE BIOGEL PI IND STRL 7.0 (GLOVE) ×2 IMPLANT
GLOVE BIOGEL PI INDICATOR 7.0 (GLOVE) ×4
GLOVE ECLIPSE 7.5 STRL STRAW (GLOVE) IMPLANT
GLOVE EXAM NITRILE LRG STRL (GLOVE) IMPLANT
GLOVE EXAM NITRILE MD LF STRL (GLOVE) ×3 IMPLANT
GLOVE EXAM NITRILE XL STR (GLOVE) IMPLANT
GLOVE EXAM NITRILE XS STR PU (GLOVE) IMPLANT
GLOVE INDICATOR 8.5 STRL (GLOVE) ×6 IMPLANT
GLOVE SURG SS PI 7.0 STRL IVOR (GLOVE) ×9 IMPLANT
GOWN BRE IMP SLV AUR LG STRL (GOWN DISPOSABLE) IMPLANT
GOWN BRE IMP SLV AUR XL STRL (GOWN DISPOSABLE) IMPLANT
GOWN STRL REIN 2XL LVL4 (GOWN DISPOSABLE) IMPLANT
GOWN STRL REUS W/ TWL XL LVL3 (GOWN DISPOSABLE) ×4 IMPLANT
GOWN STRL REUS W/TWL XL LVL3 (GOWN DISPOSABLE) ×8
KIT BASIN OR (CUSTOM PROCEDURE TRAY) ×3 IMPLANT
KIT ROOM TURNOVER OR (KITS) ×3 IMPLANT
NEEDLE HYPO 25X1 1.5 SAFETY (NEEDLE) ×3 IMPLANT
NS IRRIG 1000ML POUR BTL (IV SOLUTION) ×3 IMPLANT
PACK LAMINECTOMY NEURO (CUSTOM PROCEDURE TRAY) ×3 IMPLANT
PAD ARMBOARD 7.5X6 YLW CONV (MISCELLANEOUS) ×9 IMPLANT
PUTTY BONE DBX 5CC MIX (Putty) ×3 IMPLANT
ROD 40MM SPINAL (Rod) ×6 IMPLANT
SCREW AMP MODULAR CREO 6.5X45 (Screw) ×6 IMPLANT
SCREW CREO 6.5X40 (Screw) ×6 IMPLANT
SCREW PA THRD CREO TULIP 5.5X4 (Head) ×12 IMPLANT
SPACER RISE 8X22 11-17MM-15 (Spacer) ×3 IMPLANT
SPONGE GAUZE 4X4 12PLY (GAUZE/BANDAGES/DRESSINGS) ×3 IMPLANT
SPONGE LAP 4X18 X RAY DECT (DISPOSABLE) IMPLANT
SPONGE SURGIFOAM ABS GEL 100 (HEMOSTASIS) ×3 IMPLANT
STRIP CLOSURE SKIN 1/2X4 (GAUZE/BANDAGES/DRESSINGS) ×4 IMPLANT
SUT VIC AB 0 CT1 18XCR BRD8 (SUTURE) ×1 IMPLANT
SUT VIC AB 0 CT1 8-18 (SUTURE) ×2
SUT VIC AB 2-0 CT1 18 (SUTURE) ×3 IMPLANT
SUT VICRYL 4-0 PS2 18IN ABS (SUTURE) ×3 IMPLANT
SYR 20ML ECCENTRIC (SYRINGE) ×3 IMPLANT
TAPE STRIPS DRAPE STRL (GAUZE/BANDAGES/DRESSINGS) ×3 IMPLANT
TOWEL OR 17X24 6PK STRL BLUE (TOWEL DISPOSABLE) ×3 IMPLANT
TOWEL OR 17X26 10 PK STRL BLUE (TOWEL DISPOSABLE) ×3 IMPLANT
TRAY FOLEY CATH 14FRSI W/METER (CATHETERS) ×3 IMPLANT
WATER STERILE IRR 1000ML POUR (IV SOLUTION) ×3 IMPLANT

## 2013-06-09 NOTE — Progress Notes (Signed)
Pt requesting to get oob and stand, advised of immediate post risks of activity. Remains in bed at this time. infromed pt of need for back brace for mobility issues. Pt in aggreement with this. Brace delivered to pt per orthotics supplier. Instructed on use, remind pt of need to remain in bed throughout tonight.

## 2013-06-09 NOTE — Op Note (Signed)
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 Due to patient's failure conservative treatment imaging findings and progression of clinical syndrome I recommended a decompression stabilization procedure at L4-5 I extensively reviewed the risks and benefits of a person with the patient as well as perioperative course expectations about alternatives of surgery and she understood and agreed to proceed forward.  Operative procedure: Patient brought into the or was induced under general anesthesia and positioned prone the Wilson frame her back was prepped and draped in routine sterile fashion preoperative x-ray localize the appropriate level so after infiltration 10 cc lidocaine with epi a  midline incision was made and Bovie light cautery was used to gas subcutaneous tissues and subperiosteal dissections care lamina of L4 and L5 bilaterally. The TPS L4 and L5 were also exposed bilaterally. Interoperative x-ray confirmed the appropriate level so than the spinous process was removed high-speed drill was used to drill down the medial aspect of the complex complete central decompression was begun and complete medial facetectomies were performed external synovial cyst were immediately identified in the facet joint L4-5 and prior to the decompression there was noted hypermobility around the spinolaminar complexes at L4. After complete medial facetectomies were performed marked facet arthropathy was removed decompressing the L4 and L5 nerve root tracking the L4 nerve root all pars extensively laterally to confirm adequate decompression. The L5 nerve root was also decompressed flush with pedicle and tracked inferiorly. After aggressive abutting both superior tickling facet complex a ladder axis the lateral margins of disc space attention was then taken to pedicle screw placement using a high-speed drill pilot holes were drilled under fluoroscopic guidance then cannula with the awl probed O55 tap probed again and a 6 5 x 45 screw inserted L4 and 6 5 x 40 at L5. All screws had excellent purchase attention at this point was then taken to the interbody spacers. Epidural veins are available size the spaces and sizable size and cleanout pituitary rongeurs a 12 mm distractor was inserted this had good apposition the endplates and felt to be appropriate sizing for the implants. So working contralaterally disc space was cleaned out with a rotating cutters Epstein curettes and pituitary rongeurs. After adequate endplate preparation been achieved and 11 expandable the 17 mm rise cages packed with local autograft mixed DBX 22 mm in length this was inserted and expanded opening of the disc space  and increased lordosis.  Fluoroscopy used the step along the way confirm good position bone graft was intact laterally to the cage in the distractor was removed and had the spaces cleanout on the opposite side again endplates were prepped with a rotating cutters Epstein curettes the central autograft mixed DBX was packed as well as lateral and additional cage was inserted. After all the about work been done AP and lateral fluoroscopy confirmed good position of the implants then the wound scope to see her get meticulous hemostasis was maintained aggressive decortication was care MTPs or lateral gutters local are graft was intact posterior laterally along the TPS lateral facet complexes and the heads were assembled onto the posts the rods were then applied 45 mm top tightness tightened down and torqued down of the foraminal reinspected confirm patency and no migration of graft material. Gelfoam was overlaid top of the dura a large abductor was placed and the wounds closed in layers with after Vicryl at the end of case all needle counts sponge counts were correct Dermabond benzo and Steri-Strips 4 x 4's and OpSite were applied patient recovered in stable condition. At the case on it counts sponge counts were correct.

## 2013-06-09 NOTE — Preoperative (Signed)
Beta Blockers   Reason not to administer Beta Blockers:Not Applicable 

## 2013-06-09 NOTE — Progress Notes (Signed)
   CARE MANAGEMENT NOTE 06/09/2013  Patient:  Maria French, Maria French   Account Number:  [id number]  Date Initiated:  06/09/2013  Documentation initiated by:  Olga Coaster  Subjective/Objective Assessment:   ADMITTED FOR SURGERY     Action/Plan:   CM FOLLOWING FOR DCP   Anticipated DC Date:  06/12/2013   Anticipated DC Plan:  TO RE-EVAL WHEN PT/OT EVALS COMPLETED FOR DISPOSITION NEEDS WITH ATTENDING MD APPROVAL      Status of service:  In process, will continue to follow Medicare Important Message given?  NA - LOS <3 / Initial given by admissions (If response is "NO", the following Medicare IM given date fields will be blank)  Per UR Regulation:  Reviewed for med. necessity/level of care/duration of stay  Comments:  2/11/2015Mindi Slicker RN,BSN,MHA [phone]

## 2013-06-09 NOTE — Anesthesia Postprocedure Evaluation (Signed)
  Anesthesia Post-op Note  Patient: NIMSI MALES  Procedure(s) Performed: Procedure(s): Lumbar four-five Posterior Lumbar Interbody Fusion (N/A)  Patient Location: PACU  Anesthesia Type:General  Level of Consciousness: awake, alert  and oriented  Airway and Oxygen Therapy: Patient Spontanous Breathing  Post-op Pain: mild  Post-op Assessment: Post-op Vital signs reviewed, Patient's Cardiovascular Status Stable, Respiratory Function Stable, Patent Airway, No signs of Nausea or vomiting and Pain level controlled  Post-op Vital Signs: Reviewed and stable  Complications: No apparent anesthesia complications

## 2013-06-09 NOTE — Transfer of Care (Signed)
Immediate Anesthesia Transfer of Care Note  Patient: Maria French  Procedure(s) Performed: Procedure(s): Lumbar four-five Posterior Lumbar Interbody Fusion (N/A)  Patient Location: PACU  Anesthesia Type:General  Level of Consciousness: lethargic and responds to stimulation  Airway & Oxygen Therapy: Patient Spontanous Breathing and Patient connected to nasal cannula oxygen  Post-op Assessment: Report given to PACU RN and Patient moving all extremities X 4  Post vital signs: Reviewed and stable  Complications: No apparent anesthesia complications

## 2013-06-09 NOTE — Anesthesia Preprocedure Evaluation (Addendum)
Anesthesia Evaluation  Patient identified by MRN, date of birth, ID band Patient awake    Reviewed: Allergy & Precautions, H&P , NPO status , Patient's Chart, lab work & pertinent test results  History of Anesthesia Complications Negative for: history of anesthetic complications  Airway Mallampati: II TM Distance: >3 FB Neck ROM: Full    Dental  (+) Teeth Intact, Dental Advisory Given   Pulmonary neg sleep apnea, neg COPDneg recent URI, Current Smoker,    Pulmonary exam normal       Cardiovascular - angina- CHF - dysrhythmias - Valvular Problems/MurmursRhythm:Regular Rate:Normal     Neuro/Psych Anxiety Lumbar spondylosis with back pain worse down right leg, on chronic pain meds    GI/Hepatic negative GI ROS, Neg liver ROS,   Endo/Other  negative endocrine ROS  Renal/GU negative Renal ROS  negative genitourinary   Musculoskeletal negative musculoskeletal ROS (+)   Abdominal   Peds  Hematology negative hematology ROS (+)   Anesthesia Other Findings   Reproductive/Obstetrics                          Anesthesia Physical Anesthesia Plan  ASA: II  Anesthesia Plan: General   Post-op Pain Management:    Induction: Intravenous  Airway Management Planned: Oral ETT  Additional Equipment: None  Intra-op Plan:   Post-operative Plan: Extubation in OR  Informed Consent: I have reviewed the patients History and Physical, chart, labs and discussed the procedure including the risks, benefits and alternatives for the proposed anesthesia with the patient or authorized representative who has indicated his/her understanding and acceptance.   Dental advisory given  Plan Discussed with: CRNA and Surgeon  Anesthesia Plan Comments:         Anesthesia Quick Evaluation

## 2013-06-09 NOTE — Progress Notes (Signed)
PT Cancellation Note  Patient Details Name: Maria French MRN: [id number] DOB: 1980/11/26   Cancelled Treatment:    Reason Eval/Treat Not Completed: Patient not medically ready, Orders for ambulate POD 1.   Duncan Dull 06/09/2013, 2:25 PM Alben Deeds, [place] DPT  [phone]

## 2013-06-09 NOTE — Anesthesia Procedure Notes (Signed)
Procedure Name: Intubation Date/Time: 06/09/2013 8:36 AM Performed by: Barrington Ellison Pre-anesthesia Checklist: Patient identified, Emergency Drugs available, Suction available, Patient being monitored and Timeout performed Patient Re-evaluated:Patient Re-evaluated prior to inductionOxygen Delivery Method: Circle system utilized Preoxygenation: Pre-oxygenation with 100% oxygen Intubation Type: IV induction Ventilation: Oral airway inserted - appropriate to patient size and Mask ventilation without difficulty Laryngoscope Size: Mac and 3 Grade View: Grade II Tube type: Oral Tube size: 7.0 mm Number of attempts: 1 Airway Equipment and Method: Stylet Placement Confirmation: ETT inserted through vocal cords under direct vision,  positive ETCO2 and breath sounds checked- equal and bilateral Secured at: 21 cm Tube secured with: Tape Dental Injury: Teeth and Oropharynx as per pre-operative assessment

## 2013-06-09 NOTE — Progress Notes (Deleted)
Patient ID: Maria French, female   DOB: 1981-04-02, 33 y.o.   MRN: [id number] Had the caudal epidural. Again i did explain to her that she does not need surgery. Dc in am

## 2013-06-09 NOTE — H&P (Signed)
Maria French is an 33 y.o. female.   Chief Complaint: back and left leg pain HPI: patient complains of oprogressively worsening back and leg pain for the last several weeks and months refractory to all forms of conservative treatment.  Pain radiates to the top of her foot and big toe.  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  Due to her failure of conservative treatment and imaging findings with progression of clinical exam, I have recommended a lumbar L45 interbody fusion.  I have expensively reviewed the risks and benefits, perioperative course, alternatives to surgery  And expectations of outcome with the patient.  She understands and agrees to proceed forward.  Past Medical History  Diagnosis Date  . Back pain     Past Surgical History  Procedure Laterality Date  . Tubal ligation    . Myringotomy with tube placement      numerous times as a child    History reviewed. No pertinent family history. Social History:  reports that she has been smoking Cigarettes.  She has a 2.5 pack-year smoking history. She does not have any smokeless tobacco history on file. She reports that she does not drink alcohol or use illicit drugs.  Allergies: No Known Allergies  Medications Prior to Admission  Medication Sig Dispense Refill  . clonazePAM (KLONOPIN) 0.5 MG tablet Take 0.25 mg by mouth 2 (two) times daily as needed for anxiety.      Marland Kitchen HYDROcodone-acetaminophen (NORCO/VICODIN) 5-325 MG per tablet Take 1 tablet by mouth every 6 (six) hours as needed for moderate pain.      . IBUPROFEN PO Take 1 tablet by mouth daily as needed (for pain).       . traMADol (ULTRAM) 50 MG tablet Take 1 tablet (50 mg total) by mouth every 6 (six) hours as needed for pain.  20 tablet  0    No results found for this or any previous visit (from the past 48 hour(s)). No results found.  Review of Systems  Constitutional: Negative.   HENT: Negative.   Eyes:  Negative.   Respiratory: Negative.   Cardiovascular: Negative.   Gastrointestinal: Negative.   Genitourinary: Negative.   Musculoskeletal: Negative.   Skin: Negative.   Neurological: Positive for sensory change.  Endo/Heme/Allergies: Negative.   Psychiatric/Behavioral: Negative.     Blood pressure 126/81, pulse 84, temperature 97.1 F (36.2 C), temperature source Oral, resp. rate 20, last menstrual period 05/18/2013, SpO2 98.00%. Physical Exam  Constitutional: She is oriented to person, place, and time. She appears well-developed and well-nourished.  HENT:  Head: Normocephalic.  Eyes: Pupils are equal, round, and reactive to light.  Neck: Normal range of motion.  Respiratory: Effort normal.  GI: Soft. Bowel sounds are normal.  Musculoskeletal: Normal range of motion.  Neurological: She is alert and oriented to person, place, and time. She has normal strength. GCS eye subscore is 4. GCS verbal subscore is 5. GCS motor subscore is 6.  Reflex Scores:      Patellar reflexes are 0 on the right side and 0 on the left side.      Achilles reflexes are 0 on the right side and 0 on the left side. Strength 5/5 in iliospsoas, quads, hamstrings, AT, Gastrocs and EHL  Skin: Skin is warm and dry.     Assessment/Plan 33 yo female presents for an L45 posterior lumbar interbody fusion. Mekiyah Gladwell P 06/09/2013, 8:08 AM

## 2013-06-10 MED ORDER — ZOLPIDEM TARTRATE 5 MG PO TABS
10.0000 mg | ORAL_TABLET | Freq: Every evening | ORAL | Status: DC | PRN
  Administered 2013-06-10: 10 mg via ORAL
  Filled 2013-06-10 (×2): qty 2

## 2013-06-10 NOTE — Progress Notes (Signed)
Subjective: Patient reports She's doing well no leg pain back pains well-controlled she is ambulating  Objective: Vital signs in last 24 hours: Temp:  [97.6 F (36.4 C)-98.4 F (36.9 C)] 97.7 F (36.5 C) (02/12 0543) Pulse Rate:  [85-114] 92 (02/12 0543) Resp:  [15-20] 18 (02/12 0543) BP: (105-118)/(55-63) 113/63 mmHg (02/12 0543) SpO2:  [94 %-100 %] 96 % (02/12 0543) Weight:  [81.647 kg (180 lb)] 81.647 kg (180 lb) (02/11 1900)  Intake/Output from previous day: 02/11 0701 - 02/12 0700 In: 2610 [P.O.:360; I.V.:2000; IV Piggyback:250] Out: 2450 [Urine:1990; Drains:210; Blood:250] Intake/Output this shift:    Awake alert strength 5 out of 5 wound clean and dry  Lab Results: No results found for this basename: WBC, HGB, HCT, PLT,  in the last 72 hours BMET No results found for this basename: NA, K, CL, CO2, GLUCOSE, BUN, CREATININE, CALCIUM,  in the last 72 hours  Studies/Results: Dg Lumbar Spine 2-3 Views  06/09/2013   CLINICAL DATA:  L4-5 fusion.  EXAM: LUMBAR SPINE - 2-3 VIEW; DG C-ARM 1-60 MIN  COMPARISON:  MR lumbar spine 12/16/2012.  FINDINGS: We are provided with 2 fluoroscopic spot views of the lumbar spine. Images demonstrate pedicle screw placement at L4-5 with interbody fusion cages in place. Laminectomy defect at L4-5 is noted. No acute abnormality is seen.  IMPRESSION: Laminectomy and fusion L4-5.   Electronically Signed   By: Inge Rise M.D.   On: 06/09/2013 12:08   Dg C-arm 1-60 Min  06/09/2013   CLINICAL DATA:  L4-5 fusion.  EXAM: LUMBAR SPINE - 2-3 VIEW; DG C-ARM 1-60 MIN  COMPARISON:  MR lumbar spine 12/16/2012.  FINDINGS: We are provided with 2 fluoroscopic spot views of the lumbar spine. Images demonstrate pedicle screw placement at L4-5 with interbody fusion cages in place. Laminectomy defect at L4-5 is noted. No acute abnormality is seen.  IMPRESSION: Laminectomy and fusion L4-5.   Electronically Signed   By: Inge Rise M.D.   On: 06/09/2013 12:08     Assessment/Plan: Continue to mobilize with physical therapy probable discharge tomorrow  LOS: 1 day     Maria French P 06/10/2013, 8:11 AM

## 2013-06-10 NOTE — Evaluation (Signed)
Occupational Therapy Evaluation Patient Details Name: Maria French MRN: [id number] DOB: 12-25-1980 Today's Date: 06/10/2013 Time: 0630-1601 OT Time Calculation (min): 34 min  OT Assessment / Plan / Recommendation History of present illness Patient is 33 yo female, s/p Lumbar four-five Posterior Lumbar Interbody Fusion.   Clinical Impression   Pt admitted with above.  She demonstrates the below listed deficits.  She will benefit from continued OT to maximize safety and independence with BADLs to allow pt to return home at modified independent level.  Recommend HHOT to address safety with IADLs and infant care at home.     OT Assessment  Patient needs continued OT Services    Follow Up Recommendations  Home health OT    Barriers to Discharge      Equipment Recommendations  None recommended by OT    Recommendations for Other Services    Frequency  Min 2X/week    Precautions / Restrictions Precautions Precautions: Back Precaution Comments: educated and reviewed precautions Required Braces or Orthoses: Spinal Brace Spinal Brace: Lumbar corset   Pertinent Vitals/Pain     ADL  Eating/Feeding: Independent Where Assessed - Eating/Feeding: Chair Grooming: Wash/dry hands;Wash/dry face;Teeth care;Supervision/safety Where Assessed - Grooming: Unsupported standing Upper Body Bathing: Supervision/safety Where Assessed - Upper Body Bathing: Unsupported sitting Lower Body Bathing: Supervision/safety Where Assessed - Lower Body Bathing: Unsupported sit to stand Upper Body Dressing: Supervision/safety Where Assessed - Upper Body Dressing: Unsupported sitting Lower Body Dressing: Supervision/safety Where Assessed - Lower Body Dressing: Unsupported sit to stand Toilet Transfer: Supervision/safety Toilet Transfer Method: Sit to stand;Stand pivot Science writer: Regular height toilet Toileting - Clothing Manipulation and Hygiene: Supervision/safety Where Assessed -  Best boy and Hygiene: Sit to stand from 3-in-1 or toilet Tub/Shower Transfer: Supervision/safety Tub/Shower Transfer Method: Stand pivot;Squat pivot Equipment Used: Back brace;Rolling walker Transfers/Ambulation Related to ADLs: SUPERVISION ADL Comments: Pt with difficulty accessing peri area.  Pt instructed in options to safely perform peri care, including changing position, and use of toileting aid.  Instructed pt in options for tub seat and she will acquire on her own.  Pt also instructed in use of reacher, but reports her children will assist her.  Discussed safety with household management and infant care    OT Diagnosis: Generalized weakness;Acute pain  OT Problem List: Decreased strength;Pain;Decreased knowledge of use of DME or AE;Decreased knowledge of precautions OT Treatment Interventions: Self-care/ADL training;DME and/or AE instruction;Therapeutic activities;Patient/family education   OT Goals(Current goals can be found in the care plan section) Acute Rehab OT Goals Patient Stated Goal: to go home tomorrow OT Goal Formulation: With patient Time For Goal Achievement: 06/17/13 Potential to Achieve Goals: Good ADL Goals Additional ADL Goal #1: Pt will be independent with back precautions during all BADLs and functional mobility  Visit Information  Last OT Received On: 06/10/13 Assistance Needed: +1 History of Present Illness: Patient is 33 yo female, s/p Lumbar four-five Posterior Lumbar Interbody Fusion.       Prior [place] expects to be discharged to:: Private residence Living Arrangements: Spouse/significant other;Children Available Help at Discharge: Family Type of Home: Apartment Home Access: Level entry Home Layout: One level Home Equipment: Radiation protection practitioner Equipment: Long-handled sponge Prior Function Level of Independence: Independent Communication Communication: No difficulties Dominant  Hand: Right         Vision/Perception     Cognition  Cognition Arousal/Alertness: Awake/alert Behavior During Therapy: WFL for tasks assessed/performed Overall Cognitive Status: Within Functional Limits for  tasks assessed    Extremity/Trunk Assessment Upper Extremity Assessment Upper Extremity Assessment: Overall WFL for tasks assessed Lower Extremity Assessment Lower Extremity Assessment: Defer to PT evaluation     Mobility Bed Mobility General bed mobility comments: pt recieved up in chair Transfers Overall transfer level: Needs assistance Equipment used: None Transfers: Sit to/from Stand;Stand Pivot Transfers Sit to Stand: Supervision Stand pivot transfers: Supervision General transfer comment: VCs for hand placement and technique with compliance     Exercise     Balance Balance Overall balance assessment: No apparent balance deficits (not formally assessed)   End of Session OT - End of Session Equipment Utilized During Treatment: Back brace;Rolling walker Activity Tolerance: Patient tolerated treatment well Patient left: in chair;with call bell/phone within reach Nurse Communication: Mobility status  GO     Maria French M 06/10/2013, 11:59 AM

## 2013-06-10 NOTE — Consult Note (Signed)
[place] Student Nurse [facility]/[place] Becky Sax EDD

## 2013-06-10 NOTE — Progress Notes (Signed)
Patient ambulated in the hallway with brace and walker. Tolerated very well. Hemovac drained 100 ml for the shift. Foley d/cd.  K / RN, SCRN

## 2013-06-10 NOTE — Evaluation (Signed)
Physical Therapy Evaluation Patient Details Name: Maria French MRN: [id number] DOB: [birth year] Today's Date: 06/10/2013 Time: 7867-6720 PT Time Calculation (min): 26 min  PT Assessment / Plan / Recommendation History of Present Illness  Patient is 33 yo female, s/p Lumbar four-five Posterior Lumbar Interbody Fusion.  Clinical Impression  Patient demonstrates deficits in functional mobility as indicated below. Will benefit from continued skilled PT to address deficits and maximize function. Will see as indicated and progress as tolerated.     PT Assessment  Patient needs continued PT services    Follow Up Recommendations  No PT follow up;Supervision - Intermittent    Does the patient have the potential to tolerate intense rehabilitation      Barriers to Discharge        Equipment Recommendations   (TBD)    Recommendations for Other Services     Frequency Min 5X/week    Precautions / Restrictions Precautions Precautions: Back Precaution Comments: educated and reviewed precautions Required Braces or Orthoses: Spinal Brace Spinal Brace: Lumbar corset   Pertinent Vitals/Pain 8/10      Mobility  Bed Mobility General bed mobility comments: pt recieved up in chair Transfers Overall transfer level: Needs assistance Equipment used: None Transfers: Sit to/from Stand Sit to Stand: Supervision General transfer comment: VCs for hand placement and technique with compliance Ambulation/Gait Ambulation/Gait assistance: Modified independent (Device/Increase time);Supervision Ambulation Distance (Feet): 820 Feet Assistive device: Rolling walker (2 wheeled);None Gait Pattern/deviations: Step-through pattern Gait velocity: decreased Gait velocity interpretation: Below normal speed for age/gender General Gait Details: very cautious and guarded with some instability when not using RW, much steadier with RW    Exercises     PT Diagnosis: Difficulty walking;Abnormality of  gait;Acute pain  PT Problem List: Decreased activity tolerance;Decreased balance;Decreased mobility PT Treatment Interventions: DME instruction;Gait training;Stair training;Functional mobility training;Therapeutic activities;Therapeutic exercise;Balance training;Patient/family education     PT Goals(Current goals can be found in the care plan section) Acute Rehab PT Goals Patient Stated Goal: to go home tomorrow PT Goal Formulation: With patient Time For Goal Achievement: 06/24/13 Potential to Achieve Goals: Good  Visit Information  Last PT Received On: 06/10/13 Assistance Needed: +1 History of Present Illness: Patient is 33 yo female, s/p Lumbar four-five Posterior Lumbar Interbody Fusion.       Prior [place] expects to be discharged to:: Private residence Living Arrangements: Spouse/significant other;Children Available Help at Discharge: Family Type of Home: Apartment Home Access: Level entry Home Layout: One level [place]: None Prior Function Level of Independence: Independent Communication Communication: No difficulties Dominant Hand: Right    Cognition  Cognition Arousal/Alertness: Awake/alert Behavior During Therapy: WFL for tasks assessed/performed Overall Cognitive Status: Within Functional Limits for tasks assessed    Extremity/Trunk Assessment Upper Extremity Assessment Upper Extremity Assessment: Defer to OT evaluation Lower Extremity Assessment Lower Extremity Assessment: Overall WFL for tasks assessed (limited by pain)   Balance Balance Overall balance assessment: No apparent balance deficits (not formally assessed)  End of Session PT - End of Session Equipment Utilized During Treatment: Gait belt;Back brace Activity Tolerance: Patient tolerated treatment well;Patient limited by pain Patient left: in chair;with call bell/phone within reach Nurse Communication: Mobility status  GP     Duncan Dull 06/10/2013,  10:58 AM Alben Deeds, PT DPT  [phone]

## 2013-06-11 MED ORDER — OXYCODONE HCL 10 MG PO TABS: ORAL_TABLET | ORAL | Status: DC

## 2013-06-11 MED ORDER — CYCLOBENZAPRINE HCL 10 MG PO TABS: 10.0000 mg | ORAL_TABLET | Freq: Three times a day (TID) | ORAL | Status: AC | PRN

## 2013-06-11 NOTE — Progress Notes (Signed)
Patient ID: Maria French, female   DOB: 01/04/1981, 33 y.o.   MRN: [id number] Doing well no leg pain minimal back pain strength out of 5 wound clean and dry a stable for discharge

## 2013-06-11 NOTE — Progress Notes (Signed)
Physical Therapy Treatment Patient Details Name: Maria French MRN: [id number] DOB: 1981-01-03 Today's Date: 06/11/2013 Time: 3295-1884 PT Time Calculation (min): 27 min  PT Assessment / Plan / Recommendation  History of Present Illness Patient is 33 yo female, s/p Lumbar four-five Posterior Lumbar Interbody Fusion.   PT Comments   Patient educated on car transfer as well as stair negotiation today. Patient still demonstrates some deficits with mobility and pain control, benefits from use of RW during ambulation. Spoke with patient and mother at length regarding safety and compliance with precautions.    Follow Up Recommendations  No PT follow up;Supervision - Intermittent     Does the patient have the potential to tolerate intense rehabilitation     Barriers to Discharge        Equipment Recommendations  Rolling walker with 5" wheels    Recommendations for Other Services    Frequency Min 5X/week   Progress towards PT Goals Progress towards PT goals: Progressing toward goals  Plan Current plan remains appropriate    Precautions / Restrictions Precautions Precautions: Back Precaution Comments: educated and reviewed precautions Required Braces or Orthoses: Spinal Brace Spinal Brace: Lumbar corset   Pertinent Vitals/Pain 7/10    Mobility  Bed Mobility General bed mobility comments: pt recieved up in chair Transfers Overall transfer level: Needs assistance Equipment used: Rolling walker (2 wheeled) Transfers: Sit to/from Stand Sit to Stand: Supervision General transfer comment: VCs for hand placement and technique with compliance Ambulation/Gait Ambulation/Gait assistance: Modified independent (Device/Increase time) Ambulation Distance (Feet): 420 Feet Assistive device: Rolling walker (2 wheeled);None Gait Pattern/deviations: Step-through pattern Gait velocity: decreased Gait velocity interpretation: Below normal speed for age/gender General Gait Details: very  cautious and guarded with some instability when not using RW, much steadier with RW Stairs: Yes Stairs assistance: Supervision Stair Management: Two rails Number of Stairs: 6 General stair comments: steady      PT Goals (current goals can now be found in the care plan section) Acute Rehab PT Goals Patient Stated Goal: to go home tomorrow PT Goal Formulation: With patient Time For Goal Achievement: 06/24/13 Potential to Achieve Goals: Good  Visit Information  Last PT Received On: 06/11/13 Assistance Needed: +1 History of Present Illness: Patient is 33 yo female, s/p Lumbar four-five Posterior Lumbar Interbody Fusion.    Subjective Data  Subjective: I am ready to go Patient Stated Goal: to go home tomorrow   Cognition  Cognition Arousal/Alertness: Awake/alert Behavior During Therapy: WFL for tasks assessed/performed Overall Cognitive Status: Within Functional Limits for tasks assessed       End of Session PT - End of Session Equipment Utilized During Treatment: Gait belt;Back brace Activity Tolerance: Patient tolerated treatment well;Patient limited by pain Patient left: in chair;with call bell/phone within reach Nurse Communication: Mobility status   GP     Duncan Dull 06/11/2013, 9:36 AM Alben Deeds, [place] DPT  [phone]

## 2013-06-11 NOTE — Progress Notes (Signed)
Pt equipment arrived and pt ambulated off unit with her rolling walker. Pt discharge home with her mother and belongings at side.

## 2013-06-11 NOTE — Progress Notes (Signed)
Pt discharge education and instructions completed with pt and mother at side. Pt denies any questions, all lines including IV removed from pt. Pt educated and encouraged to use her incentive spirometer. Pt back brace remain on, pt belongings at side and pt waiting on her DME rolling walker for home. Will continue to monitor pt quietly.

## 2013-06-11 NOTE — Discharge Summary (Signed)
  Physician Discharge Summary  Patient ID: Maria French MRN: [id number] DOB/AGE: 1981/03/06 33 y.o.  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  Consults: Significant Diagnostic Studies: Treatments: L4-5 decompression and fusion Discharge Exam: Blood pressure 99/57, pulse 79, temperature 97.7 F (36.5 C), temperature source Oral, resp. rate 18, height 5\' 1"  (1.549 m), weight 81.647 kg (180 lb), last menstrual period 05/18/2013, SpO2 99.00%. Strength 5 out of 5 wound clean and dry  Disposition: Home     Medication List    TAKE these medications       cyclobenzaprine 10 MG tablet  Commonly known as:  FLEXERIL  Take 1 tablet (10 mg total) by mouth 3 (three) times daily as needed for muscle spasms.     IBUPROFEN PO  Take 1 tablet by mouth daily as needed (for pain).     KLONOPIN 0.5 MG tablet  Generic drug:  clonazePAM  Take 0.25 mg by mouth 2 (two) times daily as needed for anxiety.     Oxycodone HCl 10 MG Tabs  Commonly known as:  ROXICODONE  1-2 po q4 prn     traMADol 50 MG tablet  Commonly known as:  ULTRAM  Take 1 tablet (50 mg total) by mouth every 6 (six) hours as needed for pain.      ASK your doctor about these medications       HYDROcodone-acetaminophen 5-325 MG per tablet  Commonly known as:  NORCO/VICODIN  Take 1 tablet by mouth every 6 (six) hours as needed for moderate pain.           Follow-up Information   Follow  up with [facility] P, MD.   Specialty:  Neurosurgery   Contact information:   [address] [place] [address] [phone]       Signed: Brelan Hannen P 06/11/2013, 7:39 AM

## 2013-06-11 NOTE — Discharge Instructions (Signed)
No lifting no bending no twisting no driving a riding a car less she is going back and forth to see me. Keep the incision clean and dry cover the Steri-Strips with Saran wrap for showers only. May remove the outer dressing after 2-3 days. No sitting for longer than 45 minutes upright at a time ambulate multiple times a day for no more than 15 minutes at a time

## 2014-01-17 ENCOUNTER — Emergency Department (HOSPITAL_COMMUNITY)
Admission: EM | Admit: 2014-01-17 | Discharge: 2014-01-17 | Disposition: A | Discharge status: Home / Self Care | Payer: BC Managed Care – PPO | Attending: Emergency Medicine | Admitting: Emergency Medicine

## 2014-01-17 ENCOUNTER — Encounter (HOSPITAL_COMMUNITY): Payer: Self-pay | Admitting: Emergency Medicine

## 2014-01-17 ENCOUNTER — Emergency Department (HOSPITAL_COMMUNITY): Payer: BC Managed Care – PPO

## 2014-01-17 DIAGNOSIS — F17200 Nicotine dependence, unspecified, uncomplicated: Secondary | ICD-10-CM | POA: Insufficient documentation

## 2014-01-17 DIAGNOSIS — G8929 Other chronic pain: Secondary | ICD-10-CM | POA: Diagnosis not present

## 2014-01-17 DIAGNOSIS — Z79899 Other long term (current) drug therapy: Secondary | ICD-10-CM | POA: Insufficient documentation

## 2014-01-17 DIAGNOSIS — M5430 Sciatica, unspecified side: Secondary | ICD-10-CM | POA: Diagnosis not present

## 2014-01-17 DIAGNOSIS — R3919 Other difficulties with micturition: Secondary | ICD-10-CM | POA: Diagnosis not present

## 2014-01-17 DIAGNOSIS — F411 Generalized anxiety disorder: Secondary | ICD-10-CM | POA: Diagnosis not present

## 2014-01-17 DIAGNOSIS — M5441 Lumbago with sciatica, right side: Secondary | ICD-10-CM

## 2014-01-17 DIAGNOSIS — R159 Full incontinence of feces: Secondary | ICD-10-CM

## 2014-01-17 DIAGNOSIS — R269 Unspecified abnormalities of gait and mobility: Secondary | ICD-10-CM | POA: Insufficient documentation

## 2014-01-17 DIAGNOSIS — R39198 Other difficulties with micturition: Secondary | ICD-10-CM

## 2014-01-17 DIAGNOSIS — M549 Dorsalgia, unspecified: Secondary | ICD-10-CM | POA: Insufficient documentation

## 2014-01-17 HISTORY — DX: Anxiety disorder, unspecified: F41.9

## 2014-01-17 LAB — URINALYSIS, ROUTINE W REFLEX MICROSCOPIC
Bilirubin Urine: NEGATIVE
Glucose, UA: NEGATIVE mg/dL
Hgb urine dipstick: NEGATIVE
Ketones, ur: NEGATIVE mg/dL
LEUKOCYTES UA: NEGATIVE
NITRITE: NEGATIVE
Protein, ur: NEGATIVE mg/dL
Specific Gravity, Urine: 1.003 — ABNORMAL LOW (ref 1.005–1.030)
UROBILINOGEN UA: 0.2 mg/dL (ref 0.0–1.0)
pH: 6 (ref 5.0–8.0)

## 2014-01-17 LAB — CBC WITH DIFFERENTIAL/PLATELET
BASOS ABS: 0 10*3/uL (ref 0.0–0.1)
BASOS PCT: 0 % (ref 0–1)
Eosinophils Absolute: 0.1 10*3/uL (ref 0.0–0.7)
Eosinophils Relative: 1 % (ref 0–5)
HCT: 38.2 % (ref 36.0–46.0)
HEMOGLOBIN: 13 g/dL (ref 12.0–15.0)
Lymphocytes Relative: 44 % (ref 12–46)
Lymphs Abs: 3.3 10*3/uL (ref 0.7–4.0)
MCH: 29.2 pg (ref 26.0–34.0)
MCHC: 34 g/dL (ref 30.0–36.0)
MCV: 85.8 fL (ref 78.0–100.0)
Monocytes Absolute: 0.5 10*3/uL (ref 0.1–1.0)
Monocytes Relative: 6 % (ref 3–12)
NEUTROS ABS: 3.6 10*3/uL (ref 1.7–7.7)
NEUTROS PCT: 49 % (ref 43–77)
Platelets: 200 10*3/uL (ref 150–400)
RBC: 4.45 MIL/uL (ref 3.87–5.11)
RDW: 14.7 % (ref 11.5–15.5)
WBC: 7.5 10*3/uL (ref 4.0–10.5)

## 2014-01-17 LAB — COMPREHENSIVE METABOLIC PANEL
ALBUMIN: 3.9 g/dL (ref 3.5–5.2)
ALT: 19 U/L (ref 0–35)
ANION GAP: 13 (ref 5–15)
AST: 20 U/L (ref 0–37)
Alkaline Phosphatase: 82 U/L (ref 39–117)
BILIRUBIN TOTAL: 0.2 mg/dL — AB (ref 0.3–1.2)
BUN: 7 mg/dL (ref 6–23)
CO2: 25 mEq/L (ref 19–32)
Calcium: 9.1 mg/dL (ref 8.4–10.5)
Chloride: 103 mEq/L (ref 96–112)
Creatinine, Ser: 0.57 mg/dL (ref 0.50–1.10)
GFR calc Af Amer: >90 mL/min (ref >90)
GFR calc non Af Amer: >90 mL/min (ref >90)
Glucose, Bld: 82 mg/dL (ref 70–99)
Potassium: 3.9 mEq/L (ref 3.7–5.3)
Sodium: 141 mEq/L (ref 137–147)
TOTAL PROTEIN: 7.4 g/dL (ref 6.0–8.3)

## 2014-01-17 MED ORDER — OXYCODONE-ACETAMINOPHEN 5-325 MG PO TABS: 2.0000 | ORAL_TABLET | Freq: Once | ORAL | Status: DC

## 2014-01-17 MED ORDER — OXYCODONE-ACETAMINOPHEN 5-325 MG PO TABS
1.0000 | ORAL_TABLET | Freq: Once | ORAL | Status: AC
  Administered 2014-01-17: 1 via ORAL
  Filled 2014-01-17: qty 1

## 2014-01-17 NOTE — ED Notes (Signed)
BAck surgery in feb and for  Past week and half it hard for it come out and she hadnumbness in vag area after sex w/ husband and she had bm onherself and she did not know it over the weekend sent by her dr. Had a fuction capacity test on the 9th and her pain is worse

## 2014-01-17 NOTE — ED Notes (Signed)
Pt. Reports spinal fusion surgery in February. States x2 weeks ago had a change in pain medicine and pain was improving but then x1 week ago had FCT test and has had increased pain since. States she has noticed increased numbness to vaginal area/hips/legs. States she feels a constat "pressure" downwards. Reports difficulty urinating and having a BM on self without realizing it.

## 2014-01-17 NOTE — ED Notes (Signed)
Peri-care done prior to insertion. Spoke with PA prior to insertion of foley vs in&out. PA requesting foley put in due to acute urinary retention and output monitoring.

## 2014-01-17 NOTE — ED Notes (Signed)
This RN was a witness for PA to do rectal exam.

## 2014-01-17 NOTE — Discharge Instructions (Signed)
Return to the emergency room with worsening of symptoms, new symptoms or with symptoms that are concerning, especially worsening back pain, loss of bowel control, fevers, chills, night sweats, unexplained weight loss. Continue to take your current back pain medications. Do not operate machinery, drive or drink alcohol while taking narcotics or muscle relaxers. Follow up with your primary care provider for options for management of urinary and bowel control.  Follow up with Dr. Saintclair Halsted to discuss your recent MRI and continuation of back pain management. Please call your doctor for a followup appointment within 24-48 hours. When you talk to your doctor please let them know that you were seen in the emergency department and have them acquire all of your records so that they can discuss the findings with you and formulate a treatment plan to fully care for your new and ongoing problems. If you do not have a primary care provider please call the number below under ED resources to establish care with a provider and follow up.    Emergency Department Resource Guide 1) Find a Doctor and Pay Out of Pocket Although you won't have to find out who is covered by your insurance plan, it is a good idea to ask around and get recommendations. You will then need to call the office and see if the doctor you have chosen will accept you as a new patient and what types of options they offer for patients who are self-pay. Some doctors offer discounts or will set up payment plans for their patients who do not have insurance, but you will need to ask so you aren't surprised when you get to your appointment.  2) Contact Your Local Health Department Not all health departments have doctors that can see patients for sick visits, but many do, so it is worth a call to see if yours does. If you don't know where your local health department is, you can check in your phone book. The CDC also has a tool to help you locate your state's health  department, and many state websites also have listings of all of their local health departments.  3) Find a [facility] If your illness is not likely to be very severe or complicated, you may want to try a walk in clinic. These are popping up all over the country in pharmacies, drugstores, and shopping centers. They're usually staffed by nurse practitioners or physician assistants that have been trained to treat common illnesses and complaints. They're usually fairly quick and inexpensive. However, if you have serious medical issues or chronic medical problems, these are probably not your best option.  No Primary Care Doctor: - Call Health Connect at  [phone] - they can help you locate a primary care doctor that  accepts your insurance, provides certain services, etc. - Physician Referral Service- [phone]  Chronic Pain Problems: Organization         Address  Phone   Notes  [facility]  [phone] Patients need to be referred by their primary care doctor.   Medication Assistance: Organization         Address  Phone   Notes  [facility] Medication [facility] [address] [place], [place] [address] [phone] --Must be a resident of [facility] -- Must have NO insurance coverage whatsoever (no Medicaid/ Medicare, etc.) -- The pt. MUST have a primary care doctor that directs their care regularly and follows them in the community   MedAssist  (866)  [place]  [phone]    Agencies that provide inexpensive medical care: Organization         Address  Phone   Notes  [place]  [phone]   [facility]    [phone]   [facility] [place], [place] [address] [phone]   [place] [address] [place] [phone]   Planned Parenthood    [phone]   [facility]    [phone]    [place] and [address], [place] Phone:  [phone], Fax:  [phone] Hours of Operation:  9 am - 6 pm, M-F.  Also accepts Medicaid/Medicare and self-pay.  [facility] for [address], [address], [place] Phone: [phone], Fax: [phone]. Hours of Operation:  8:30 am - 5:30 pm, M-F.  Also accepts Medicaid and self-pay.  [facility] [place] [address], [place] Phone: [phone]   [place], [place], [place] [phone], Ext. [phone] Mondays & Thursdays: 7-9 AM.  First 15 patients are seen on a first come, first serve basis.    [place] Providers:  Organization         Address  Phone   Notes  [facility] [address], [place] [phone] Also accepts self-pay patients.  [facility] [address], [place]  [phone]   [place], [address], [place] [phone]   [facility] [address], [place] [phone]   Lucianne Lei [address], [place]   [phone] Only accepts [place] Access [place] patients after they have their name applied to their card.   Self-Pay (no insurance) in [facility]:  Organization         Address  Phone   Notes  Sickle Cell Patients, [facility] [place] [phone]   [facility] [place] [phone]   [facility] [place]  [place], [place], [place] [phone]   [facility]/Dr. Osei-Bonsu  [address], [place] or [address], [place] [phone] Phone number for both [place] and [place] locations is the same.  Urgent Medical and [facility] [address], [place] [phone]    [facility] [address] [place] or [address] [phone]   [facility] [address], [place] [phone], phone; [phone], fax Sees patients 1st and 3rd Saturday of every month.  Must not qualify for public or private insurance (i.e. Medicaid, Medicare, [place] Health Choice, Veterans' Benefits)  Household income should be no more than 200% of the poverty level The clinic cannot treat you if you are pregnant or think you are pregnant  Sexually transmitted diseases are not treated at the clinic.    Dental Care: Organization         Address  Phone  Notes  [facility] Department of [facility] [place] [phone] Accepts children up to age 47 who are enrolled in [place] or [place]; pregnant women with  a Medicaid card; and children who have applied for Medicaid or [place] Health Choice, but were declined, whose parents can pay a reduced fee at time of service.  [facility] Department of [facility]  [address], [place] [phone] Accepts children up to age 60 who are enrolled in [place] or [place]; pregnant women with a Medicaid card; and children who have applied for Medicaid or [place] Health Choice, but were declined, whose parents can pay a reduced fee at time of service.  [place] Adult Dental Access PROGRAM  [place] [phone] Patients are seen by appointment only. Walk-ins are not accepted. [place] will see patients 53 years of age and older. Monday - Tuesday (8am-5pm) Most Wednesdays (8:30-5pm) $30 per visit, cash only  [facility] Adult Dental Access PROGRAM  [address], [facility] [phone] Patients are seen by appointment only. Walk-ins are not accepted. [place] will see patients 30 years of age and older. One Wednesday Evening (Monthly: Volunteer Based).   $30 per visit, cash only  [place]  [phone] for adults; Children under age 78, call Graduate Pediatric Dentistry at [phone]. Children aged 42-14, please call [phone] to request a pediatric application.  Dental services are provided in all areas of dental care including fillings, crowns and bridges, complete and partial dentures, implants, gum treatment, root canals, and extractions. Preventive care is also provided. Treatment is provided to both adults and children. Patients are selected via a lottery and there is often a waiting list.   [facility] [address], [place]  [phone] [email or web address]   [facility] [address] [place], [place] [phone], Ext. [phone] Second and Fourth Thursday of each month, opens at 6:30 AM; Clinic ends at 9 AM.  Patients are seen on a first-come first-served basis, and a limited number are seen during each clinic.   [facility]  [address] Hillard Danker [place], [place] [phone]   Eligibility Requirements You must have lived in [place], [place], or [place] counties for at least the last three months.   You cannot be eligible for state or federal sponsored [employer], including [employer], [place], or [employer].   You generally cannot be eligible for healthcare insurance through your employer.    How to apply: Eligibility screenings are held every Tuesday and Wednesday afternoon from 1:00 pm until 4:00 pm. You do not need an appointment for the interview!  [facility] [address] [place], [place]   [place]  [place] Department  [place]  [phone]    Behavioral Health Resources in the Community: Intensive Outpatient Programs Organization         Address  Phone  Notes  [place]. [address], [place], [place] [phone]   [facility] [address], [place], [place]   ADS: Alcohol & Drug Svcs [address], [place], [place]   [place] [address]  [place], [place] or [phone]   Substance Abuse Resources Organization         Address  Phone  Notes  Alcohol and Drug Services  [phone]   [facility]  [phone]   [facility] [place]   [facility]  [phone]   Residential & Outpatient Substance Abuse Program  [phone]   Psychological Services Organization         Address  Phone  Notes  [facility] [place]  [place]  [phone]   [place] [address] [place] or [phone]    Mobile Crisis Teams Organization         Address  Phone  Notes  [facility], Mobile Crisis Care Unit  [phone]   [facility]  [address] [place], [place]   [place] [address] [place] [phone]    Self-Help/Support Groups Organization         Address  Phone             Notes  [place]. of [place] - variety of support groups  [facility] Call for more information  Narcotics Anonymous (NA), [facility] [address], [employer] [place]  2 meetings at this location   Special educational needs teacher         Address  Phone  Notes  [facility] Residential Treatment [place],    [place]  [phone]   [facility]  [address] [place] [address], [place], [place]   [place], [place] [phone] Admissions: 8am-3pm M-F  Incentives Substance [place] [address],    [place], [place] [phone]   [facility] [address] [place], [place], [place]   [facility] [address],  [place], [place]   [facility] - Intensive Outpatient [address], [place], [place]   [facility] ([place] [address].,  [place], [place] [phone] or [phone]   [facility]idential Treatment Services (RTS) [address] [place], [place] Accepts Medicaid  [facility] [place] [address] [place] [address],  [place] [phone] Substance Abuse/Addiction Treatment   [facility] Organization         Address  Phone  Notes  [facility]  [phone]   Domenic Schwab, PhD [address] Arlis [place], [place]   [phone] or [phone]   [place], [place] [phone]   [facility] [address] [place], [place] [phone] Insurance/Medicaid/sponsorship through [facility] and Families [address], [address] [place]                                    [place], [place] [phone] [place] [address], [place] [phone]    Dr. Adele Schilder  [phone]   [facility] Dept. 1) [address], [place] 2) [place] 3)  [address], [place] [phone]  [phone]   [place] [phone] or [phone] (After Hours)

## 2014-01-17 NOTE — ED Provider Notes (Signed)
CSN: [id number]     Arrival date & time 01/17/14  1023 History   First MD Initiated Contact with Patient 01/17/14 1117     Chief Complaint  Patient presents with  . Back Pain     HPI Maria French is a 33 y.o. female with PMH of back pain and anxiety presenting with worsening back pain since 01/05/14. Pain is described as a pressure and it is more painful to walk. She also reports fecal incontinence. She reports she does not know that she has to go until she has already gone. This happen this weekend once. No other occurrences. She also reports numbness in her vaginal area during sex. No weakness. She had a function capacity test on 9th with physical therapy recommended by Dr. Saintclair Halsted where they "tested her limits" by putting her back through range of exercises including lifting which was when the increased pain started. Patient also had change in her medication dose the week before from opana 15mg  to morphine 15mg  plus norco for break through pain. She is seen by a pain clinic. Patient is also seen by neurosurgery. Dr. Kary Kos. She had lumbar decompression surgery in February 2015.    Past Medical History  Diagnosis Date  . Back pain   . Anxiety    Past Surgical History  Procedure Laterality Date  . Tubal ligation    . Myringotomy with tube placement      numerous times as a child  . Back surgery     No family history on file. History  Substance Use Topics  . Smoking status: Current Every Day Smoker -- 0.50 packs/day for 5 years    Types: Cigarettes  . Smokeless tobacco: Not on file  . Alcohol Use: No   OB History   Grav Para Term Preterm Abortions TAB SAB Ect Mult Living                 Review of Systems  Constitutional: Negative for fever and chills.  HENT: Negative for congestion and rhinorrhea.   Eyes: Negative for visual disturbance.  Respiratory: Negative for cough and shortness of breath.   Cardiovascular: Negative for chest pain and palpitations.  Gastrointestinal:  Negative for nausea, vomiting and diarrhea.  Genitourinary: Negative for dysuria and hematuria.  Musculoskeletal: Positive for back pain and gait problem.  Skin: Negative for rash.  Neurological: Negative for weakness and headaches.      Allergies  Review of patient's allergies indicates no known allergies.  Home Medications   Prior to Admission medications   Medication Sig Start Date End Date Taking? Authorizing Provider  clonazePAM (KLONOPIN) 0.5 MG tablet Take 0.25 mg by mouth 2 (two) times daily as needed for anxiety.   Yes Historical Provider, MD  cyclobenzaprine (FLEXERIL) 10 MG tablet Take 1 tablet (10 mg total) by mouth 3 (three) times daily as needed for muscle spasms. 06/11/13  Yes Elaina Hoops, MD  DULoxetine (CYMBALTA) 60 MG capsule Take 60 mg by mouth daily.   Yes Historical Provider, MD  HYDROcodone-acetaminophen (NORCO) 10-325 MG per tablet Take 1 tablet by mouth every 8 (eight) hours as needed for severe pain. Break through pain   Yes Historical Provider, MD  ibuprofen (ADVIL,MOTRIN) 200 MG tablet Take 800 mg by mouth every 6 (six) hours as needed for mild pain.   Yes Historical Provider, MD  morphine (MS CONTIN) 15 MG 12 hr tablet Take 15 mg by mouth every 12 (twelve) hours.   Yes Historical Provider, MD  pregabalin (LYRICA) 225 MG capsule Take 225 mg by mouth 2 (two) times daily.   Yes Historical Provider, MD   BP 123/84  Pulse 90  Temp(Src) 97.6 F (36.4 C) (Oral)  Resp 13  SpO2 98% Physical Exam  Nursing note and vitals reviewed. Constitutional: She appears well-developed and well-nourished. No distress.  HENT:  Head: Normocephalic and atraumatic.  Eyes: Conjunctivae are normal. Right eye exhibits no discharge. Left eye exhibits no discharge.  Cardiovascular: Normal rate, regular rhythm and normal heart sounds.   Pulmonary/Chest: Effort normal and breath sounds normal. No respiratory distress. She has no wheezes.  Abdominal: Soft. Bowel sounds are normal. She  exhibits no distension. There is no tenderness.  Genitourinary:  External rectum without tenderness to palpation without visible fissures. Internal rectum with normal tone without masses or lesions. Patient able to bear down. Patient denied sensation during exam. Nursing tech in room during exam.    Musculoskeletal:  Midline back tenderness, step off or crepitus. Surgical scar, well healed. Right sided lower back tenderness. No CVA tenderness.   Neurological: She is alert. Coordination normal.  Equal muscle tone. 5/5 strength in lower extremities. DTR equal and intact. Negative straight leg test. Antalgic gait.   Skin: Skin is warm and dry. She is not diaphoretic.    ED Course  Procedures (including critical care time) Labs Review Labs Reviewed  COMPREHENSIVE METABOLIC PANEL - Abnormal; Notable for the following:    Total Bilirubin 0.2 (*)    All other components within normal limits  URINALYSIS, ROUTINE W REFLEX MICROSCOPIC - Abnormal; Notable for the following:    APPearance CLOUDY (*)    Specific Gravity, Urine 1.003 (*)    All other components within normal limits  CBC WITH DIFFERENTIAL    Imaging Review Mr Lumbar Spine Wo Contrast  01/17/2014   CLINICAL DATA:  33 year old female status post spine surgery in fibular a with increased pain, and increased saddle numbness. Difficulty with urination and bowel movements. Initial encounter.  EXAM: MRI LUMBAR SPINE WITHOUT CONTRAST  TECHNIQUE: Multiplanar, multisequence MR imaging of the lumbar spine was performed. No intravenous contrast was administered.  COMPARISON:  Lumbar spine CT 11/25/2013. Lumbar MRI 07/11/2013, and earlier.  FINDINGS: Same numbering system as on 07/11/2013. Interval resolved postoperative fluid collection previously seen in the laminectomy space at L4-L5, and tracking into the subcutaneous soft tissues. Mild subcutaneous edema persists. Mild susceptibility artifact re - identified from L4-L5 posterior and interbody  hardware. See additional details of that level below. Postoperative changes to the lower erector spinae musculature re- identified.  Visualized lower thoracic spinal cord is normal with conus medularis at L1-L2.  Negative visualized abdominal viscera. There is a new greater than 3 cm diameter cystic lesion in the left hemipelvis adjacent to the adnexae a (series 5, image 12).  T11-T12:  Negative.  T12-L1:  Negative.  L1-L2:  Negative.  L2-L3:  Negative.  L3-L4:  Mild facet and ligament flavum hypertrophy.  No stenosis.  L4-L5: Sequelae of decompression and fusion. Widely patent thecal sac, with increased thecal sac patency since March. No stenosis.  L5-S1: Mildly increased epidural lipomatosis. Mild disc desiccation. Subtle central annular fissure. Minor disc bulge. No stenosis.  IMPRESSION: 1. Satisfactory postoperative appearance at L4-L5. 2. Minimal to mild adjacent segment disease at L3-L4 and L5-S1. No stenosis. 3. Larger than 3 cm cyst of the left adnexa, visualized portions appear simple. This may be new since March but is most likely physiologic.   Electronically Signed   By:  Lars Pinks M.D.   On: 01/17/2014 16:45     EKG Interpretation None     Meds given in ED:  Medications  oxyCODONE-acetaminophen (PERCOCET/ROXICET) 5-325 MG per tablet 1 tablet (1 tablet Oral Given 01/17/14 1500)    New Prescriptions   No medications on file      MDM   Final diagnoses:  Right-sided low back pain with sciatica, sciatica laterality unspecified  Fecal incontinence  Urinary dysfunction   Patient with history of chronic back pain presenting with worsening back pain and fecal incontinence and urinary dysfunction. No fever, chills, nightsweats, unintentional weight loss, IVDU. VSS. Patient with reassuring exam. Equal strength and DTR bilaterally. Antalgic gait. Rectal exam with good tone and pt able to bear down. Bladder scan with 500cc of urine after voiding and patient unable to void afterwards.  Proceeded to MRI. MRI with mild facet hypertrophy at L3-L4 and minor disc bulge at L5-S1. These mild pathologies do not explain her symptoms. Patient to follow up with PCP for further management of urinary and bowel complaints. Patient to follow up with Dr. Saintclair Halsted for back pain and for explanation of MRI findings. Patient is afebrile, nontoxic, and in no acute distress. Patient is appropriate for outpatient management and is stable for discharge.  Discussed return precautions with patient. Discussed all results and patient verbalizes understanding and agrees with plan.  This is a shared patient. This patient was discussed with the physician, Dr. Eulis Foster who saw and evaluated the patient and agrees with the plan.     Pura Spice, PA-C 01/17/14 1800

## 2014-01-17 NOTE — ED Notes (Signed)
PA at bedside.

## 2014-01-17 NOTE — ED Provider Notes (Signed)
  Face-to-face evaluation   History: She c/o LBP for several weeks, worsening, now with numb perineum, inability to completely void, and a single episode of fecal incontinence.  Physical exam: Alert, cooperative. Back nontender to palpation. Extremities- intact light touch sensation, both legs, bilaterally  Medical screening examination/treatment/procedure(s) were conducted as a shared visit with non-physician practitioner(s) and myself.  I personally evaluated the patient during the encounter  Richarda Blade, MD 01/20/14 [phone]

## 2016-05-25 ENCOUNTER — Encounter (HOSPITAL_BASED_OUTPATIENT_CLINIC_OR_DEPARTMENT_OTHER): Payer: Self-pay | Admitting: Emergency Medicine

## 2016-05-25 ENCOUNTER — Emergency Department (HOSPITAL_BASED_OUTPATIENT_CLINIC_OR_DEPARTMENT_OTHER)
Admission: EM | Admit: 2016-05-25 | Discharge: 2016-05-25 | Disposition: A | Discharge status: Home / Self Care | Payer: Self-pay | Attending: Emergency Medicine | Admitting: Emergency Medicine

## 2016-05-25 DIAGNOSIS — N390 Urinary tract infection, site not specified: Secondary | ICD-10-CM | POA: Insufficient documentation

## 2016-05-25 DIAGNOSIS — Z791 Long term (current) use of non-steroidal anti-inflammatories (NSAID): Secondary | ICD-10-CM | POA: Insufficient documentation

## 2016-05-25 DIAGNOSIS — F17210 Nicotine dependence, cigarettes, uncomplicated: Secondary | ICD-10-CM | POA: Insufficient documentation

## 2016-05-25 DIAGNOSIS — Z79899 Other long term (current) drug therapy: Secondary | ICD-10-CM | POA: Insufficient documentation

## 2016-05-25 LAB — URINALYSIS, ROUTINE W REFLEX MICROSCOPIC
Bilirubin Urine: NEGATIVE
Glucose, UA: NEGATIVE mg/dL
Ketones, ur: NEGATIVE mg/dL
NITRITE: NEGATIVE
PH: 6 (ref 5.0–8.0)
Protein, ur: 100 mg/dL — AB
SPECIFIC GRAVITY, URINE: 1.022 (ref 1.005–1.030)

## 2016-05-25 LAB — URINALYSIS, MICROSCOPIC (REFLEX)

## 2016-05-25 MED ORDER — CEPHALEXIN 250 MG PO CAPS
1000.0000 mg | ORAL_CAPSULE | Freq: Once | ORAL | Status: AC
  Administered 2016-05-25: 1000 mg via ORAL
  Filled 2016-05-25: qty 4

## 2016-05-25 MED ORDER — PHENAZOPYRIDINE HCL 100 MG PO TABS
95.0000 mg | ORAL_TABLET | Freq: Once | ORAL | Status: AC
  Administered 2016-05-25: 100 mg via ORAL
  Filled 2016-05-25: qty 1

## 2016-05-25 MED ORDER — CEPHALEXIN 500 MG PO CAPS: ORAL_CAPSULE | ORAL | 0 refills | Status: DC

## 2016-05-25 NOTE — ED Provider Notes (Signed)
[facility] Provider Note   CSN: [id number] Arrival date & time: 05/25/16  1221     History   Chief Complaint Chief Complaint  Patient presents with  . Dysuria    HPI Maria French is a 36 y.o. female.   Dysuria   This is a recurrent problem. The current episode started more than 2 days ago. The problem occurs every urination. The pain is moderate. She is sexually active. There is a history of pyelonephritis. Pertinent negatives include no chills. She has tried nothing for the symptoms. Her past medical history is significant for recurrent UTIs.    Past Medical History:  Diagnosis Date  . Anxiety   . Back pain     Patient Active Problem List   Diagnosis Date Noted  . Spinal stenosis at L4-L5 level 06/09/2013    Past Surgical History:  Procedure Laterality Date  . ABDOMINAL HYSTERECTOMY    . BACK SURGERY    . MYRINGOTOMY WITH TUBE PLACEMENT     numerous times as a child  . TUBAL LIGATION      OB History    No data available       Home Medications    Prior to Admission medications   Medication Sig Start Date End Date Taking? Authorizing Provider  HYDROcodone-acetaminophen (NORCO) 10-325 MG per tablet Take 1 tablet by mouth every 8 (eight) hours as needed for severe pain. Break through pain   Yes Historical Provider, MD  morphine (MS CONTIN) 15 MG 12 hr tablet Take 15 mg by mouth every 12 (twelve) hours.   Yes Historical Provider, MD  cephALEXin (KEFLEX) 500 MG capsule 2 caps po bid x 7 days 05/25/16   Merrily Pew, MD  clonazePAM (KLONOPIN) 0.5 MG tablet Take 0.25 mg by mouth 2 (two) times daily as needed for anxiety.    Historical Provider, MD  cyclobenzaprine (FLEXERIL) 10 MG tablet Take 1 tablet (10 mg total) by mouth 3 (three) times daily as needed for muscle spasms. 06/11/13   Kary Kos, MD  DULoxetine (CYMBALTA) 60 MG capsule Take 60 mg by mouth daily.    Historical Provider, MD  ibuprofen (ADVIL,MOTRIN) 200 MG tablet Take 800 mg by mouth  every 6 (six) hours as needed for mild pain.    Historical Provider, MD  pregabalin (LYRICA) 225 MG capsule Take 225 mg by mouth 2 (two) times daily.    Historical Provider, MD    Family History No family history on file.  Social History Social History  Substance Use Topics  . Smoking status: Current Every Day Smoker    Packs/day: 0.50    Years: 5.00    Types: Cigarettes  . Smokeless tobacco: Never Used  . Alcohol use No     Allergies   Patient has no known allergies.   Review of Systems Review of Systems  Constitutional: Negative for chills.  Respiratory: Negative for cough and shortness of breath.   Genitourinary: Positive for dysuria.  All other systems reviewed and are negative.    Physical Exam Updated Vital Signs BP 124/78 (BP Location: Right Arm)   Pulse 86   Temp 97.6 F (36.4 C) (Oral)   Resp 18   Ht 5\' 1"  (1.549 m)   Wt 180 lb (81.6 kg)   LMP 05/18/2013   SpO2 100%   BMI 34.01 kg/m   Physical Exam  Constitutional: She is oriented to person, place, and time. She appears well-developed and well-nourished.  HENT:  Head: Normocephalic and atraumatic.  Eyes: Conjunctivae and EOM are normal.  Neck: Normal range of motion.  Cardiovascular: Normal rate and regular rhythm.   Pulmonary/Chest: Effort normal. No stridor. No respiratory distress.  Abdominal: She exhibits no distension.  Musculoskeletal: Normal range of motion. She exhibits no edema or deformity.  Neurological: She is alert and oriented to person, place, and time. No cranial nerve deficit.  Skin: Skin is warm and dry.  Nursing note and vitals reviewed.    ED Treatments / Results  Labs (all labs ordered are listed, but only abnormal results are displayed) Labs Reviewed  URINALYSIS, ROUTINE W REFLEX MICROSCOPIC - Abnormal; Notable for the following:       Result Value   APPearance CLOUDY (*)    Hgb urine dipstick LARGE (*)    Protein, ur 100 (*)    Leukocytes, UA LARGE (*)    All  other components within normal limits  URINALYSIS, MICROSCOPIC (REFLEX) - Abnormal; Notable for the following:    Bacteria, UA MANY (*)    Squamous Epithelial / LPF 6-30 (*)    All other components within normal limits    EKG  EKG Interpretation None       Radiology No results found.  Procedures Procedures (including critical care time)  Medications Ordered in ED Medications  phenazopyridine (PYRIDIUM) tablet 100 mg (100 mg Oral Given 05/25/16 1328)  cephALEXin (KEFLEX) capsule 1,000 mg (1,000 mg Oral Given 05/25/16 1444)     Initial Impression / Assessment and Plan / ED Course  I have reviewed the triage vital signs and the nursing notes.  Pertinent labs & imaging results that were available during my care of the patient were reviewed by me and considered in my medical decision making (see chart for details).     UTI. No e/o complications.   Final Clinical Impressions(s) / ED Diagnoses   Final diagnoses:  Lower urinary tract infectious disease    New Prescriptions New Prescriptions   CEPHALEXIN (KEFLEX) 500 MG CAPSULE    2 caps po bid x 7 days     Merrily Pew, MD 05/25/16 1459

## 2016-05-25 NOTE — ED Triage Notes (Addendum)
Dysuria since last Sunday and aching and chills for past 4 days. Has chronic back pain per usual, nausea . Is in pain clinic

## 2017-04-11 ENCOUNTER — Encounter (HOSPITAL_BASED_OUTPATIENT_CLINIC_OR_DEPARTMENT_OTHER): Payer: Self-pay | Admitting: Emergency Medicine

## 2017-04-11 ENCOUNTER — Other Ambulatory Visit: Payer: Self-pay

## 2017-04-11 ENCOUNTER — Emergency Department (HOSPITAL_BASED_OUTPATIENT_CLINIC_OR_DEPARTMENT_OTHER)
Admission: EM | Admit: 2017-04-11 | Discharge: 2017-04-11 | Disposition: A | Discharge status: Home / Self Care | Payer: BLUE CROSS/BLUE SHIELD | Attending: Emergency Medicine | Admitting: Emergency Medicine

## 2017-04-11 ENCOUNTER — Emergency Department (HOSPITAL_BASED_OUTPATIENT_CLINIC_OR_DEPARTMENT_OTHER): Payer: BLUE CROSS/BLUE SHIELD

## 2017-04-11 DIAGNOSIS — J209 Acute bronchitis, unspecified: Secondary | ICD-10-CM | POA: Diagnosis not present

## 2017-04-11 DIAGNOSIS — R070 Pain in throat: Secondary | ICD-10-CM | POA: Insufficient documentation

## 2017-04-11 DIAGNOSIS — R0982 Postnasal drip: Secondary | ICD-10-CM | POA: Diagnosis not present

## 2017-04-11 DIAGNOSIS — R05 Cough: Secondary | ICD-10-CM | POA: Diagnosis present

## 2017-04-11 DIAGNOSIS — F17210 Nicotine dependence, cigarettes, uncomplicated: Secondary | ICD-10-CM | POA: Diagnosis not present

## 2017-04-11 DIAGNOSIS — R509 Fever, unspecified: Secondary | ICD-10-CM | POA: Insufficient documentation

## 2017-04-11 DIAGNOSIS — R109 Unspecified abdominal pain: Secondary | ICD-10-CM | POA: Diagnosis not present

## 2017-04-11 DIAGNOSIS — R0981 Nasal congestion: Secondary | ICD-10-CM | POA: Diagnosis not present

## 2017-04-11 DIAGNOSIS — Z79899 Other long term (current) drug therapy: Secondary | ICD-10-CM | POA: Insufficient documentation

## 2017-04-11 MED ORDER — PREDNISONE 20 MG PO TABS
40.0000 mg | ORAL_TABLET | Freq: Once | ORAL | Status: AC
Start: 2017-04-11 — End: 2017-04-11
  Administered 2017-04-11: 40 mg via ORAL
  Filled 2017-04-11: qty 2

## 2017-04-11 MED ORDER — PREDNISONE 20 MG PO TABS: 40.0000 mg | ORAL_TABLET | Freq: Every day | ORAL | 0 refills | Status: AC

## 2017-04-11 MED ORDER — HYDROCOD POLST-CPM POLST ER 10-8 MG/5ML PO SUER: 5.0000 mL | Freq: Every evening | ORAL | 0 refills | Status: AC | PRN

## 2017-04-11 MED ORDER — AEROCHAMBER PLUS FLO-VU MEDIUM MISC
1.0000 | Freq: Once | Status: AC
  Administered 2017-04-11: 1
  Filled 2017-04-11: qty 1

## 2017-04-11 MED ORDER — BENZONATATE 100 MG PO CAPS: 100.0000 mg | ORAL_CAPSULE | Freq: Three times a day (TID) | ORAL | 0 refills | Status: AC

## 2017-04-11 MED ORDER — ALBUTEROL SULFATE HFA 108 (90 BASE) MCG/ACT IN AERS
1.0000 | INHALATION_SPRAY | Freq: Once | RESPIRATORY_TRACT | Status: AC
  Administered 2017-04-11: 1 via RESPIRATORY_TRACT
  Filled 2017-04-11: qty 6.7

## 2017-04-11 NOTE — ED Provider Notes (Signed)
[facility] EMERGENCY DEPARTMENT Provider Note   CSN: [id number] Arrival date & time: 04/11/17  0901     History   Chief Complaint Chief Complaint  Patient presents with  . Cough  . Groin Pain    HPI Maria French is a 36 y.o. female presenting with 2-week history of cough.  Patient states over the past 2 weeks, she has had a persistent cough.  Is nonproductive, worse at night.  It initially started with low-grade temperature, sore throat, and ear pain, which all resolved.  Patient states that the cough has lingered, despite over-the-counter medicine such as Mucinex, TheraFlu, and cough syrups.  She reports onset of lower abdominal pain yesterday, present only when she coughs.  The pain feels like a pulled muscle.  She states because she has been coughing so hard, she has had issues with her hemorrhoids, and she has an umbilical hernia which becomes irritated every time she coughs.  She has associated nasal congestion and postnasal drip.  She denies current fevers, chills, sinus pain or pressure, ear pain, sore throat, chest pain, shortness of breath, nausea, vomiting, urinary symptoms, abnormal bowel movements.   HPI  Past Medical History:  Diagnosis Date  . Anxiety   . Back pain     Patient Active Problem List   Diagnosis Date Noted  . Spinal stenosis at L4-L5 level 06/09/2013    Past Surgical History:  Procedure Laterality Date  . ABDOMINAL HYSTERECTOMY    . BACK SURGERY    . MYRINGOTOMY WITH TUBE PLACEMENT     numerous times as a child  . TUBAL LIGATION      OB History    No data available       Home Medications    Prior to Admission medications   Medication Sig Start Date End Date Taking? Authorizing Provider  gabapentin (NEURONTIN) 300 MG capsule Take 300 mg by mouth 3 (three) times daily.   Yes [provider]  HYDROcodone-acetaminophen (NORCO) 10-325 MG per tablet Take 1 tablet by mouth every 8 (eight) hours as needed for severe  pain. Break through pain   Yes [provider]  morphine (MS CONTIN) 15 MG 12 hr tablet Take 15 mg by mouth every 12 (twelve) hours.   Yes [provider]  traZODone (DESYREL) 50 MG tablet Take 50 mg by mouth at bedtime.   Yes [provider]  benzonatate (TESSALON) 100 MG capsule Take 1 capsule (100 mg total) by mouth every 8 (eight) hours. 04/11/17   Jailey Booton, PA-C  cephALEXin (KEFLEX) 500 MG capsule 2 caps po bid x 7 days 05/25/16   Mesner, Corene Cornea, MD  chlorpheniramine-HYDROcodone [facility] PENNKINETIC ER) 10-8 MG/5ML SUER Take 5 mLs by mouth at bedtime as needed for cough. 04/11/17   Prentis Langdon, PA-C  clonazePAM (KLONOPIN) 0.5 MG tablet Take 0.25 mg by mouth 2 (two) times daily as needed for anxiety.    [provider]  cyclobenzaprine (FLEXERIL) 10 MG tablet Take 1 tablet (10 mg total) by mouth 3 (three) times daily as needed for muscle spasms. 06/11/13   Kary Kos, MD  DULoxetine (CYMBALTA) 60 MG capsule Take 60 mg by mouth daily.    [provider]  ibuprofen (ADVIL,MOTRIN) 200 MG tablet Take 800 mg by mouth every 6 (six) hours as needed for mild pain.    [provider]  predniSONE (DELTASONE) 20 MG tablet Take 2 tablets (40 mg total) by mouth daily for 4 days. 04/11/17 04/15/17  Wynona Duhamel,  Edwardo Wojnarowski, PA-C  pregabalin (LYRICA) 225 MG capsule Take 225 mg by mouth 2 (two) times daily.    [provider]    Family History No family history on file.  Social History Social History   Tobacco Use  . Smoking status: Current Every Day Smoker    Packs/day: 0.50    Years: 5.00    Pack years: 2.50    Types: Cigarettes  . Smokeless tobacco: Never Used  Substance Use Topics  . Alcohol use: No  . Drug use: No     Allergies   Patient has no known allergies.   Review of Systems Review of Systems  Constitutional: Negative for chills and fever.  HENT: Positive for congestion and postnasal drip.   Respiratory:  Positive for cough.   Gastrointestinal: Positive for abdominal pain.     Physical Exam Updated Vital Signs BP 112/63 (BP Location: Right Arm)   Pulse 86   Temp 98.1 F (36.7 C) (Oral)   Resp 14   Ht 5\' 1"  (1.549 m)   Wt 86.2 kg (190 lb)   LMP 05/18/2013   SpO2 97%   BMI 35.90 kg/m   Physical Exam  Constitutional: She is oriented to person, place, and time. She appears well-developed and well-nourished. No distress.  HENT:  Head: Normocephalic and atraumatic.  Right Ear: Tympanic membrane, external ear and ear canal normal.  Left Ear: Tympanic membrane, external ear and ear canal normal.  Nose: Mucosal edema present. Right sinus exhibits no maxillary sinus tenderness and no frontal sinus tenderness. Left sinus exhibits no maxillary sinus tenderness and no frontal sinus tenderness.  Mouth/Throat: Uvula is midline, oropharynx is clear and moist and mucous membranes are normal. No tonsillar exudate.  Nasal mucosal edema.  OP clear without tonsillar swelling or exudate.  Eyes: Conjunctivae and EOM are normal. Pupils are equal, round, and reactive to light.  Neck: Normal range of motion.  Cardiovascular: Normal rate, regular rhythm and intact distal pulses.  Pulmonary/Chest: Effort normal and breath sounds normal. She has no decreased breath sounds. She has no wheezes. She has no rhonchi. She has no rales.  Pt speaking in full sentences without difficulty.  Clear lung sounds in all fields  Abdominal: Soft. She exhibits no distension. There is no tenderness. There is no rigidity, no rebound and no guarding. A hernia is present. Hernia confirmed positive in the ventral area.  Umbilical hernia that protrudes with coughing and reduces when relaxed.  No tenderness to palpation of the hernia, it is soft.  Minimal tenderness palpation of left lower quadrant.  No palpable inguinal hernia pain with coughing.  Abdomen is soft without rigidity, guarding, or distention.  Musculoskeletal: Normal  range of motion.  Lymphadenopathy:    She has no cervical adenopathy.  Neurological: She is alert and oriented to person, place, and time.  Skin: Skin is warm.  Psychiatric: She has a normal mood and affect.  Nursing note and vitals reviewed.    ED Treatments / Results  Labs (all labs ordered are listed, but only abnormal results are displayed) Labs Reviewed - No data to display  EKG  EKG Interpretation None       Radiology Dg Chest 2 View  Result Date: 04/11/2017 CLINICAL DATA:  Cough and congestion for 2 weeks EXAM: CHEST  2 VIEW COMPARISON:  June 03, 2013 FINDINGS: Lungs are clear. Heart size and pulmonary vascularity are normal. No adenopathy. No bone lesions. IMPRESSION: No edema or consolidation. Electronically Signed   By: Gwyndolyn Saxon  Jasmine December III M.D.   On: 04/11/2017 09:40    Procedures Procedures (including critical care time)  Medications Ordered in ED Medications  predniSONE (DELTASONE) tablet 40 mg (40 mg Oral Given 04/11/17 1023)  albuterol (PROVENTIL HFA;VENTOLIN HFA) 108 (90 Base) MCG/ACT inhaler 1 puff (1 puff Inhalation Given 04/11/17 1023)  AEROCHAMBER PLUS FLO-VU MEDIUM MISC 1 each (1 each Other Given 04/11/17 1023)     Initial Impression / Assessment and Plan / ED Course  I have reviewed the triage vital signs and the nursing notes.  Pertinent labs & imaging results that were available during my care of the patient were reviewed by me and considered in my medical decision making (see chart for details).     Patient presenting with 2 wk h/o cough.  Physical exam reassuring, patient is afebrile and appears nontoxic.  Pulmonary exam reassuring.  Doubt pneumonia, strep, other bacterial infection, or peritonsillar abscess. Xray negative.  Abd exam shows soft, reducible umbilical hernia and TTP of LLQ musculature. No signs of hernia incarceration at this time. Likely URI causing MSK pain in abd and irriation of hernia.  Will treat symptomatically.   Patient to follow-up with primary care as needed.  At this time, patient appears safe for discharge.  Return precautions given.  Patient states she understands and agrees to plan.   Final Clinical Impressions(s) / ED Diagnoses   Final diagnoses:  Acute bronchitis, unspecified organism    ED Discharge Orders        Ordered    predniSONE (DELTASONE) 20 MG tablet  Daily     04/11/17 1019    benzonatate (TESSALON) 100 MG capsule  Every 8 hours     04/11/17 1019    chlorpheniramine-HYDROcodone (TUSSIONEX PENNKINETIC ER) 10-8 MG/5ML SUER  At bedtime PRN     04/11/17 1019       Rudell Ortman, PA-C 04/11/17 1700    Dorie Rank, MD 04/12/17 [phone]

## 2017-04-11 NOTE — Discharge Instructions (Signed)
Use inhaler 3 times a day for the next 3 days.  After this, use it as needed for shortness of breath or cough. Take prednisone as prescribed. Use tessalon Perles for cough. Use cough syrup as needed at night for coughing. Use Tylenol or ibuprofen as needed for muscular pain. Follow-up with your primary care doctor next week if symptoms are not improving. Return to the emergency room if you develop worsening pain and hardness of your hernia, fevers, persistent vomiting, or any new or worsening symptoms.

## 2017-04-11 NOTE — ED Triage Notes (Signed)
Non productive cough x2 weeks, states she coughed so hard she developed L groin pain.

## 2017-04-27 ENCOUNTER — Encounter (HOSPITAL_BASED_OUTPATIENT_CLINIC_OR_DEPARTMENT_OTHER): Payer: Self-pay | Admitting: *Deleted

## 2017-04-27 ENCOUNTER — Other Ambulatory Visit: Payer: Self-pay

## 2017-04-27 ENCOUNTER — Emergency Department (HOSPITAL_BASED_OUTPATIENT_CLINIC_OR_DEPARTMENT_OTHER)
Admission: EM | Admit: 2017-04-27 | Discharge: 2017-04-27 | Disposition: A | Discharge status: Home / Self Care | Payer: BLUE CROSS/BLUE SHIELD | Attending: Emergency Medicine | Admitting: Emergency Medicine

## 2017-04-27 DIAGNOSIS — F17210 Nicotine dependence, cigarettes, uncomplicated: Secondary | ICD-10-CM | POA: Insufficient documentation

## 2017-04-27 DIAGNOSIS — R42 Dizziness and giddiness: Secondary | ICD-10-CM | POA: Diagnosis not present

## 2017-04-27 DIAGNOSIS — N3001 Acute cystitis with hematuria: Secondary | ICD-10-CM | POA: Diagnosis not present

## 2017-04-27 DIAGNOSIS — N898 Other specified noninflammatory disorders of vagina: Secondary | ICD-10-CM | POA: Diagnosis present

## 2017-04-27 DIAGNOSIS — Z79899 Other long term (current) drug therapy: Secondary | ICD-10-CM | POA: Insufficient documentation

## 2017-04-27 HISTORY — DX: Gastro-esophageal reflux disease with esophagitis, without bleeding: K21.00

## 2017-04-27 HISTORY — DX: Gastro-esophageal reflux disease with esophagitis: K21.0

## 2017-04-27 HISTORY — DX: Umbilical hernia without obstruction or gangrene: K42.9

## 2017-04-27 LAB — URINALYSIS, ROUTINE W REFLEX MICROSCOPIC
BILIRUBIN URINE: NEGATIVE
GLUCOSE, UA: NEGATIVE mg/dL
KETONES UR: NEGATIVE mg/dL
Nitrite: NEGATIVE
PH: 6 (ref 5.0–8.0)
Protein, ur: 30 mg/dL — AB
Specific Gravity, Urine: >1.03 — ABNORMAL HIGH (ref 1.005–1.030)

## 2017-04-27 LAB — URINALYSIS, MICROSCOPIC (REFLEX)

## 2017-04-27 MED ORDER — PHENAZOPYRIDINE HCL 100 MG PO TABS
95.0000 mg | ORAL_TABLET | Freq: Once | ORAL | Status: AC
  Administered 2017-04-27: 100 mg via ORAL
  Filled 2017-04-27: qty 1

## 2017-04-27 MED ORDER — CEPHALEXIN 500 MG PO CAPS: ORAL_CAPSULE | ORAL | 0 refills | Status: DC

## 2017-04-27 MED ORDER — PHENAZOPYRIDINE HCL 95 MG PO TABS: 95.0000 mg | ORAL_TABLET | Freq: Three times a day (TID) | ORAL | 0 refills | Status: AC | PRN

## 2017-04-27 NOTE — ED Provider Notes (Signed)
[facility] EMERGENCY DEPARTMENT Provider Note   CSN: [id number] Arrival date & time: 04/27/17  1744     History   Chief Complaint Chief Complaint  Patient presents with  . Vaginal Discharge    HPI Maria French is a 36 y.o. female significant past medical history who presents to the emergency department today for urinary symptoms.  Patient reports this feels like her prior UTI.  She admits to urinary urgency, urinary frequency, dysuria, and "bladder spasms" for the last 1 day.  Today while wiping she noticed that the some pink tinge color on the toilet paper when wiping.  The patient denies any associated fever, chills, flank pain or abdominal pain.  Patient has taken over-the-counter ibuprofen for this with mild relief.  The patient reports nothing makes his symptoms better or worse.  Patient denies any associated vaginal pain, vaginal discharge or vaginal bleeding. She has had a past hysterectomy.   Patient also reports one episode of lightheadedness today while sitting watching her TV.  She notes that she felt like an out of body experience where she was lightheaded for approximately 5 - 30 seconds.  The patient she has had these similarly in the past.  This has not reoccurred. Denies fever, HA, focal weakness, CP, SOB, palpitations, melena, diaphoresis, recent URI, changes in hearing, unilateral neck pain, unilateral weakness, facial asymmetry, difficulty with speech, change in gait, LOC, N/V, alcohol/drug use, or trauma. No new medications.    HPI  Past Medical History:  Diagnosis Date  . Anxiety   . Back pain   . Reflux esophagitis   . Umbilical hernia     Patient Active Problem List   Diagnosis Date Noted  . Spinal stenosis at L4-L5 level 06/09/2013    Past Surgical History:  Procedure Laterality Date  . ABDOMINAL HYSTERECTOMY    . BACK SURGERY    . MYRINGOTOMY WITH TUBE PLACEMENT     numerous times as a child  . TUBAL LIGATION      OB History    No data available       Home Medications    Prior to Admission medications   Medication Sig Start Date End Date Taking? Authorizing Provider  gabapentin (NEURONTIN) 300 MG capsule Take 300 mg by mouth 3 (three) times daily.   Yes [provider]  ibuprofen (ADVIL,MOTRIN) 200 MG tablet Take 800 mg by mouth every 6 (six) hours as needed for mild pain.   Yes [provider]  morphine (MS CONTIN) 15 MG 12 hr tablet Take 15 mg by mouth every 12 (twelve) hours.   Yes [provider]  omeprazole (PRILOSEC) 20 MG capsule Take 40 mg by mouth daily.   Yes [provider]  oxyCODONE-acetaminophen (PERCOCET) 10-325 MG tablet Take 1 tablet by mouth 2 (two) times daily.   Yes [provider]  pregabalin (LYRICA) 225 MG capsule Take 225 mg by mouth 2 (two) times daily.   Yes [provider]  traZODone (DESYREL) 50 MG tablet Take 50 mg by mouth at bedtime.   Yes [provider]  benzonatate (TESSALON) 100 MG capsule Take 1 capsule (100 mg total) by mouth every 8 (eight) hours. 04/11/17   Caccavale, Sophia, PA-C  cephALEXin (KEFLEX) 500 MG capsule 2 caps po bid x 7 days 05/25/16   Mesner, Corene Cornea, MD  chlorpheniramine-HYDROcodone [facility] PENNKINETIC ER) 10-8 MG/5ML SUER Take 5 mLs by mouth at bedtime as needed for cough. 04/11/17   Caccavale, Sophia, PA-C  clonazePAM (  KLONOPIN) 0.5 MG tablet Take 0.25 mg by mouth 2 (two) times daily as needed for anxiety.    [provider]  cyclobenzaprine (FLEXERIL) 10 MG tablet Take 1 tablet (10 mg total) by mouth 3 (three) times daily as needed for muscle spasms. 06/11/13   Kary Kos, MD  DULoxetine (CYMBALTA) 60 MG capsule Take 60 mg by mouth daily.    [provider]  HYDROcodone-acetaminophen (NORCO) 10-325 MG per tablet Take 1 tablet by mouth every 8 (eight) hours as needed for severe pain. Break through pain    [provider]    Family History No family history on  file.  Social History Social History   Tobacco Use  . Smoking status: Current Every Day Smoker    Packs/day: 0.50    Years: 5.00    Pack years: 2.50    Types: Cigarettes  . Smokeless tobacco: Never Used  Substance Use Topics  . Alcohol use: No  . Drug use: No     Allergies   Patient has no known allergies.   Review of Systems Review of Systems  All other systems reviewed and are negative.    Physical Exam Updated Vital Signs BP 118/79 (BP Location: Right Arm)   Pulse 88   Temp 98.3 F (36.8 C) (Oral)   Resp 18   Ht 5\' 1"  (1.549 m)   Wt 86.2 kg (190 lb)   LMP 05/18/2013   SpO2 99%   BMI 35.90 kg/m   Physical Exam  Constitutional: She appears well-developed and well-nourished.  HENT:  Head: Normocephalic and atraumatic.  Right Ear: External ear normal.  Left Ear: External ear normal.  Nose: Nose normal.  Mouth/Throat: Uvula is midline, oropharynx is clear and moist and mucous membranes are normal. No tonsillar exudate.  Eyes: Pupils are equal, round, and reactive to light. Right eye exhibits no discharge. Left eye exhibits no discharge. No scleral icterus.  Neck: Trachea normal. Neck supple. No spinous process tenderness present. No neck rigidity. Normal range of motion present.  Cardiovascular: Normal rate, regular rhythm and intact distal pulses.  No murmur heard. Pulses:      Radial pulses are 2+ on the right side, and 2+ on the left side.       Dorsalis pedis pulses are 2+ on the right side, and 2+ on the left side.       Posterior tibial pulses are 2+ on the right side, and 2+ on the left side.  No lower extremity swelling or edema. Calves symmetric in size bilaterally.  Pulmonary/Chest: Effort normal and breath sounds normal. She exhibits no tenderness.  Abdominal: Soft. Bowel sounds are normal. She exhibits no distension. There is no tenderness. There is no rigidity, no rebound, no guarding and no CVA tenderness.  Musculoskeletal: She exhibits no  edema.  Lymphadenopathy:    She has no cervical adenopathy.  Neurological: She is alert.  Mental Status:  Alert, oriented, thought content appropriate, able to give a coherent history. Speech fluent without evidence of aphasia. Able to follow 2 step commands without difficulty.  Cranial Nerves:  II:  Peripheral visual fields grossly normal, pupils equal, round, reactive to light III,IV, VI: ptosis not present, extra-ocular motions intact bilaterally  V,VII: smile symmetric, eyebrows raise symmetric, facial light touch sensation equal VIII: hearing grossly normal to voice  X: uvula elevates symmetrically  XI: bilateral shoulder shrug symmetric and strong XII: midline tongue extension without fassiculations Motor:  Normal tone. 5/5 in upper and lower extremities bilaterally  including strong and equal grip strength and dorsiflexion/plantar flexion Sensory: Sensation intact to light touch in all extremities. Negative Romberg.  Deep Tendon Reflexes: 2+ and symmetric in the biceps and patella Cerebellar: normal finger-to-nose with bilateral upper extremities. Normal heel-to -shin balance bilaterally of the lower extremity. No pronator drift.  Gait: normal gait and balance CV: distal pulses palpable throughout   Skin: Skin is warm and dry. No rash noted. She is not diaphoretic.  Psychiatric: She has a normal mood and affect.  Nursing note and vitals reviewed.    ED Treatments / Results  Labs (all labs ordered are listed, but only abnormal results are displayed) Labs Reviewed  URINALYSIS, ROUTINE W REFLEX MICROSCOPIC - Abnormal; Notable for the following components:      Result Value   APPearance CLOUDY (*)    Specific Gravity, Urine >1.030 (*)    Hgb urine dipstick LARGE (*)    Protein, ur 30 (*)    Leukocytes, UA MODERATE (*)    All other components within normal limits  URINALYSIS, MICROSCOPIC (REFLEX) - Abnormal; Notable for the following components:   Bacteria, UA MANY (*)     Squamous Epithelial / LPF 0-5 (*)    All other components within normal limits    EKG  EKG Interpretation  Date/Time:  Sunday April 27 2017 18:02:01 EST Ventricular Rate:  87 PR Interval:  124 QRS Duration: 94 QT Interval:  392 QTC Calculation: 471 R Axis:   75 Text Interpretation:  Normal sinus rhythm Normal ECG No significant change since last tracing Confirmed by Dorie Rank [phone]) on 04/27/2017 8:56:06 PM       Radiology No results found.  Procedures Procedures (including critical care time)  Medications Ordered in ED Medications - No data to display   Initial Impression / Assessment and Plan / ED Course  I have reviewed the triage vital signs and the nursing notes.  Pertinent labs & imaging results that were available during my care of the patient were reviewed by me and considered in my medical decision making (see chart for details).     Pt has been diagnosed with a UTI. Suspect what the patient saw on toliet paper was due to hematuria from uti. She declines pelvic exam at this time for further workup.  Pt is afebrile, without CVA tenderness, normotensive, and denies N/V. No evidence of pyelonephritis. Pt to be dc home with antibiotics and instructions to follow up with PCP if symptoms persist.  Also noted that she had one episode of dizziness today that she describes as lightheadedness and out of body experience for 5-30 seconds.  She is asymptomatic at this time.  On exam past has normal neurologic exam.  No vertical or rotational nystagmus. Patient normal finger-nose and normal gait.  No slurred speech renal or weakness.  Patient does not wish to be worked up further for this at this time.  I discussed risks and benefits of not being worked up for dizziness and she states understanding.  Patient instructed to followup with her primary care physician within 3 days for further evaluation. They are to return to the emergency department for new neurologic symptoms, loss  of vision or other concerning symptoms.  Final Clinical Impressions(s) / ED Diagnoses   Final diagnoses:  Acute cystitis with hematuria    ED Discharge Orders    None       Lorelle Gibbs 04/28/17 0145    Dorie Rank, MD 04/30/17 [phone]

## 2017-04-27 NOTE — Discharge Instructions (Signed)
You were seen here today for urinary symptoms and dizziness. You were diagnosed with a UTI and declined further workup for your dizziness.   Please read and follow all provided instructions You have been seen today for your complaint of pain with urination. Your lab work showed urine infection.  Your discharge medications include: 1) Keflex Please take all of your antibiotics until finished!   You may develop abdominal discomfort or diarrhea from the antibiotic.  You may help offset this with probiotics which you can buy or get in yogurt. Do not eat or take the probiotics until 2 hours after your antibiotic. Do not take your medicine if develop an itchy rash, swelling in your mouth or lips, or difficulty breathing.  2) Pyridium  This medication will help relieve pain and burning but does not treat the infection.  Make sure that you wear a panty liner as it may stain your underwear. Do not be alarmed if this turns your urine orange. To void upset stomach please take with food. Home care instructions are as follows:  1) Please drink plenty of water. Avoid tea and beverages with caffeine like coffee or soda 2) If you are sexually active, make sure to urinate immediately after intercourse.  Follow up:  Please follow up with your primary care physician in 1-2 days. If you do not have one please call the [facility] number listed above. Please seek immediate medical care if you develop any of the following symptoms: SEEK MEDICAL CARE IF:  You have back pain.  You develop a fever.  Your symptoms do not begin to resolve within 3 days.  SEEK IMMEDIATE MEDICAL CARE IF:  You have severe back pain or lower abdominal pain.  You develop chills.  You have nausea or vomiting.  You have continued burning or discomfort with urination even after completion of antibiotic. Additional Information:  Your vital signs today were: BP 118/79 (BP Location: Right Arm)    Pulse 88    Temp 98.3 F  (36.8 C) (Oral)    Resp 18    Ht 5\' 1"  (1.549 m)    Wt 86.2 kg (190 lb)    LMP 05/18/2013    SpO2 99%    BMI 35.90 kg/m  If your blood pressure (BP) was elevated above 135/85 this visit, please have this repeated by your doctor within one month. ---------------

## 2017-04-27 NOTE — ED Triage Notes (Signed)
Patient states she has a one day history of vaginal pain and pink tinged discharge.  States the pain feels like it is deep inside her vaginal area.  States she also has bladder spasms and has had them today as well.  Also, over the last few hours she has had dizzy spells that come and go.

## 2018-12-19 ENCOUNTER — Emergency Department (HOSPITAL_BASED_OUTPATIENT_CLINIC_OR_DEPARTMENT_OTHER)
Admission: EM | Admit: 2018-12-19 | Discharge: 2018-12-20 | Disposition: A | Discharge status: Home / Self Care | Payer: Medicaid Other | Attending: Emergency Medicine | Admitting: Emergency Medicine

## 2018-12-19 ENCOUNTER — Encounter (HOSPITAL_BASED_OUTPATIENT_CLINIC_OR_DEPARTMENT_OTHER): Payer: Self-pay | Admitting: Emergency Medicine

## 2018-12-19 ENCOUNTER — Other Ambulatory Visit: Payer: Self-pay

## 2018-12-19 DIAGNOSIS — D1721 Benign lipomatous neoplasm of skin and subcutaneous tissue of right arm: Secondary | ICD-10-CM | POA: Diagnosis not present

## 2018-12-19 DIAGNOSIS — Z79899 Other long term (current) drug therapy: Secondary | ICD-10-CM | POA: Diagnosis not present

## 2018-12-19 DIAGNOSIS — Z3202 Encounter for pregnancy test, result negative: Secondary | ICD-10-CM | POA: Diagnosis not present

## 2018-12-19 DIAGNOSIS — M542 Cervicalgia: Secondary | ICD-10-CM | POA: Insufficient documentation

## 2018-12-19 DIAGNOSIS — F17210 Nicotine dependence, cigarettes, uncomplicated: Secondary | ICD-10-CM | POA: Insufficient documentation

## 2018-12-19 MED ORDER — KETOROLAC TROMETHAMINE 30 MG/ML IJ SOLN
15.0000 mg | Freq: Once | INTRAMUSCULAR | Status: AC
  Administered 2018-12-20: 15 mg via INTRAVENOUS
  Filled 2018-12-19: qty 1

## 2018-12-19 NOTE — ED Notes (Signed)
No pain with ROM

## 2018-12-19 NOTE — ED Triage Notes (Signed)
Neck pain originating from "lumps" in neck that have been present for a year. Pain started two days ago. States radiated to R arm, where she also noticed a "lump" in R antecubital space. Hx fibromyalgia.

## 2018-12-19 NOTE — ED Notes (Signed)
Pt reports a "lump" to the R side of her neck that is painful- tender to touch- and radiates down into R shoulder and down R arm. Pt also has lump to right AC. Pt reports the pain is a throbbing sensation like "a toothache". Pt reports having extensive work up in the past in [place] and at [place]. Pt reports the lump has not gotten bigger but the pain started. Pt is seen a pain clinic and not requesting any narcotics at this time. Pt tearful on assessment.

## 2018-12-20 ENCOUNTER — Encounter (HOSPITAL_BASED_OUTPATIENT_CLINIC_OR_DEPARTMENT_OTHER): Payer: Self-pay

## 2018-12-20 ENCOUNTER — Emergency Department (HOSPITAL_BASED_OUTPATIENT_CLINIC_OR_DEPARTMENT_OTHER): Payer: Medicaid Other

## 2018-12-20 LAB — CBC WITH DIFFERENTIAL/PLATELET
Abs Immature Granulocytes: 0.01 10*3/uL (ref 0.00–0.07)
Basophils Absolute: 0 10*3/uL (ref 0.0–0.1)
Basophils Relative: 0 %
Eosinophils Absolute: 0.1 10*3/uL (ref 0.0–0.5)
Eosinophils Relative: 1 %
HCT: 42.3 % (ref 36.0–46.0)
Hemoglobin: 13.9 g/dL (ref 12.0–15.0)
Immature Granulocytes: 0 %
Lymphocytes Relative: 48 %
Lymphs Abs: 3.5 10*3/uL (ref 0.7–4.0)
MCH: 29.8 pg (ref 26.0–34.0)
MCHC: 32.9 g/dL (ref 30.0–36.0)
MCV: 90.6 fL (ref 80.0–100.0)
Monocytes Absolute: 0.4 10*3/uL (ref 0.1–1.0)
Monocytes Relative: 6 %
Neutro Abs: 3.3 10*3/uL (ref 1.7–7.7)
Neutrophils Relative %: 45 %
Platelets: 193 10*3/uL (ref 150–400)
RBC: 4.67 MIL/uL (ref 3.87–5.11)
RDW: 12.5 % (ref 11.5–15.5)
WBC: 7.2 10*3/uL (ref 4.0–10.5)
nRBC: 0 % (ref 0.0–0.2)

## 2018-12-20 LAB — BASIC METABOLIC PANEL
Anion gap: 10 (ref 5–15)
BUN: 21 mg/dL — ABNORMAL HIGH (ref 6–20)
CO2: 26 mmol/L (ref 22–32)
Calcium: 9.1 mg/dL (ref 8.9–10.3)
Chloride: 103 mmol/L (ref 98–111)
Creatinine, Ser: 0.78 mg/dL (ref 0.44–1.00)
GFR calc Af Amer: >60 mL/min (ref >60)
GFR calc non Af Amer: >60 mL/min (ref >60)
Glucose, Bld: 139 mg/dL — ABNORMAL HIGH (ref 70–99)
Potassium: 3.8 mmol/L (ref 3.5–5.1)
Sodium: 139 mmol/L (ref 135–145)

## 2018-12-20 LAB — PREGNANCY, URINE: Preg Test, Ur: NEGATIVE

## 2018-12-20 MED ORDER — NAPROXEN 375 MG PO TABS: 375.0000 mg | ORAL_TABLET | Freq: Two times a day (BID) | ORAL | 0 refills | Status: AC

## 2018-12-20 MED ORDER — IOHEXOL 300 MG/ML  SOLN
75.0000 mL | Freq: Once | INTRAMUSCULAR | Status: AC | PRN
  Administered 2018-12-20: 75 mL via INTRAVENOUS

## 2018-12-20 NOTE — ED Provider Notes (Signed)
[facility] Provider Note   CSN: [id number] Arrival date & time: 12/19/18  2205     History   Chief Complaint Chief Complaint  Patient presents with   Neck Pain   Arm Pain    HPI Maria French is a 38 y.o. female.     The history is provided by the patient.  Neck Pain Pain location:  R side Quality: throbbing. Pain radiates to:  Does not radiate Pain severity:  Severe Pain is:  Same all the time Onset quality:  Gradual Timing:  Constant Progression:  Unchanged Chronicity:  Recurrent Context: not fall, not jumping from heights, not lifting a heavy object, not MCA, not MVC, not pedestrian accident and not recent injury   Relieved by:  Nothing Worsened by:  Nothing Ineffective treatments:  None tried Associated symptoms: no bladder incontinence, no bowel incontinence, no chest pain, no fever, no headaches, no leg pain, no numbness, no paresis, no photophobia, no syncope, no tingling, no visual change, no weakness and no weight loss   Risk factors: no hx of head and neck radiation   Arm Pain Pertinent negatives include no chest pain, no headaches and no shortness of breath.  Also has a bump on the volar surface of the RUE.  No f/c/r. No CP. No SOB.  Her home narcotics aren't helping.    Past Medical History:  Diagnosis Date   Anxiety    Back pain    Reflux esophagitis    Umbilical hernia     Patient Active Problem List   Diagnosis Date Noted   Spinal stenosis at L4-L5 level 06/09/2013    Past Surgical History:  Procedure Laterality Date   ABDOMINAL HYSTERECTOMY     BACK SURGERY     MYRINGOTOMY WITH TUBE PLACEMENT     numerous times as a child   TUBAL LIGATION       OB History   No obstetric history on file.      Home Medications    Prior to Admission medications   Medication Sig Start Date End Date Taking? Authorizing Provider  benzonatate (TESSALON) 100 MG capsule Take 1 capsule (100 mg total) by mouth  every 8 (eight) hours. 04/11/17   Caccavale, Sophia, PA-C  cephALEXin (KEFLEX) 500 MG capsule 2 caps po bid x 7 days 04/27/17   Jillyn Ledger, PA-C  chlorpheniramine-HYDROcodone [facility]) 10-8 MG/5ML SUER Take 5 mLs by mouth at bedtime as needed for cough. 04/11/17   Caccavale, Sophia, PA-C  clonazePAM (KLONOPIN) 0.5 MG tablet Take 0.25 mg by mouth 2 (two) times daily as needed for anxiety.    [provider]  cyclobenzaprine (FLEXERIL) 10 MG tablet Take 1 tablet (10 mg total) by mouth 3 (three) times daily as needed for muscle spasms. 06/11/13   Kary Kos, MD  DULoxetine (CYMBALTA) 60 MG capsule Take 60 mg by mouth daily.    [provider]  gabapentin (NEURONTIN) 300 MG capsule Take 300 mg by mouth 3 (three) times daily.    [provider]  HYDROcodone-acetaminophen (NORCO) 10-325 MG per tablet Take 1 tablet by mouth every 8 (eight) hours as needed for severe pain. Break through pain    [provider]  ibuprofen (ADVIL,MOTRIN) 200 MG tablet Take 800 mg by mouth every 6 (six) hours as needed for mild pain.    [provider]  morphine (MS CONTIN) 15 MG 12 hr tablet Take 15 mg by mouth every 12 (twelve) hours.  [provider]  naproxen (NAPROSYN) 375 MG tablet Take 1 tablet (375 mg total) by mouth 2 (two) times daily with a meal. 12/20/18   Mortimer Bair, MD  omeprazole (PRILOSEC) 20 MG capsule Take 40 mg by mouth daily.    [provider]  oxyCODONE-acetaminophen (PERCOCET) 10-325 MG tablet Take 1 tablet by mouth 2 (two) times daily.    [provider]  phenazopyridine (PYRIDIUM) 95 MG tablet Take 1 tablet (95 mg total) by mouth 3 (three) times daily as needed for pain. 04/27/17   Maczis, Barth Kirks, PA-C  pregabalin (LYRICA) 225 MG capsule Take 225 mg by mouth 2 (two) times daily.    [provider]  traZODone (DESYREL) 50 MG tablet Take 50 mg by mouth at bedtime.    [provider]     Family History History reviewed. No pertinent family history.  Social History Social History   Tobacco Use   Smoking status: Current Every Day Smoker    Packs/day: 0.50    Years: 5.00    Pack years: 2.50    Types: Cigarettes   Smokeless tobacco: Never Used  Substance Use Topics   Alcohol use: No   Drug use: No     Allergies   Patient has no known allergies.   Review of Systems Review of Systems  Constitutional: Negative for fever and weight loss.  HENT: Negative for congestion.   Eyes: Negative for photophobia.  Respiratory: Negative for cough and shortness of breath.   Cardiovascular: Negative for chest pain, palpitations, leg swelling and syncope.  Gastrointestinal: Negative for bowel incontinence.  Genitourinary: Negative for bladder incontinence.  Musculoskeletal: Positive for neck pain. Negative for arthralgias and neck stiffness.  Neurological: Negative for tingling, weakness, numbness and headaches.  All other systems reviewed and are negative.    Physical Exam Updated Vital Signs BP 119/74    Pulse (!) 58    Temp 98.4 F (36.9 C) (Oral)    Resp 18    Ht 5\' 1"  (1.549 m)    Wt 83 kg    LMP 05/18/2013    SpO2 97%    BMI 34.58 kg/m   Physical Exam Vitals signs and nursing note reviewed.  Constitutional:      General: She is not in acute distress.    Appearance: She is normal weight.  HENT:     Head: Normocephalic and atraumatic.     Nose: Nose normal.     Mouth/Throat:     Mouth: Mucous membranes are moist.     Pharynx: Oropharynx is clear.  Eyes:     Conjunctiva/sclera: Conjunctivae normal.     Pupils: Pupils are equal, round, and reactive to light.  Neck:     Musculoskeletal: Normal range of motion and neck supple.  Cardiovascular:     Rate and Rhythm: Normal rate and regular rhythm.     Pulses: Normal pulses.     Heart sounds: Normal heart sounds.  Pulmonary:     Effort: Pulmonary effort is normal.     Breath sounds: Normal breath  sounds.  Abdominal:     General: Abdomen is flat. Bowel sounds are normal.     Tenderness: There is no abdominal tenderness. There is no guarding or rebound.  Musculoskeletal: Normal range of motion.  Skin:    General: Skin is warm and dry.     Capillary Refill: Capillary refill takes less than 2 seconds.     Comments: Lipoma on the volar surface of RUE 1.5  cm  Neurological:     General: No focal deficit present.     Mental Status: She is alert and oriented to person, place, and time.     Deep Tendon Reflexes: Reflexes normal.  Psychiatric:        Mood and Affect: Mood normal.        Behavior: Behavior normal.      ED Treatments / Results  Labs (all labs ordered are listed, but only abnormal results are displayed) Results for orders placed or performed during the hospital encounter of 12/19/18  Pregnancy, urine  Result Value Ref Range   Preg Test, Ur NEGATIVE NEGATIVE  CBC with Differential/Platelet  Result Value Ref Range   WBC 7.2 4.0 - 10.5 K/uL   RBC 4.67 3.87 - 5.11 MIL/uL   Hemoglobin 13.9 12.0 - 15.0 g/dL   HCT 42.3 36.0 - 46.0 %   MCV 90.6 80.0 - 100.0 fL   MCH 29.8 26.0 - 34.0 pg   MCHC 32.9 30.0 - 36.0 g/dL   RDW 12.5 11.5 - 15.5 %   Platelets 193 150 - 400 K/uL   nRBC 0.0 0.0 - 0.2 %   Neutrophils Relative % 45 %   Neutro Abs 3.3 1.7 - 7.7 K/uL   Lymphocytes Relative 48 %   Lymphs Abs 3.5 0.7 - 4.0 K/uL   Monocytes Relative 6 %   Monocytes Absolute 0.4 0.1 - 1.0 K/uL   Eosinophils Relative 1 %   Eosinophils Absolute 0.1 0.0 - 0.5 K/uL   Basophils Relative 0 %   Basophils Absolute 0.0 0.0 - 0.1 K/uL   Immature Granulocytes 0 %   Abs Immature Granulocytes 0.01 0.00 - 0.07 K/uL  Basic metabolic panel  Result Value Ref Range   Sodium 139 135 - 145 mmol/L   Potassium 3.8 3.5 - 5.1 mmol/L   Chloride 103 98 - 111 mmol/L   CO2 26 22 - 32 mmol/L   Glucose, Bld 139 (H) 70 - 99 mg/dL   BUN 21 (H) 6 - 20 mg/dL   Creatinine, Ser 0.78 0.44 - 1.00 mg/dL    Calcium 9.1 8.9 - 10.3 mg/dL   GFR calc non Af Amer >60 >60 mL/min   GFR calc Af Amer >60 >60 mL/min   Anion gap 10 5 - 15   Ct Soft Tissue Neck W Contrast  Result Date: 12/20/2018 CLINICAL DATA:  Initial evaluation for palpable lump at right neck EXAM: CT NECK WITH CONTRAST TECHNIQUE: Multidetector CT imaging of the neck was performed using the standard protocol following the bolus administration of intravenous contrast. CONTRAST:  26mL OMNIPAQUE IOHEXOL 300 MG/ML  SOLN COMPARISON:  None. FINDINGS: Pharynx and larynx: Oral cavity within normal limits without discrete mass or loculated fluid collection. No acute abnormality about the dentition. Oropharynx and nasopharynx within normal limits. No retropharyngeal collection. Epiglottis normal. Remainder of the hypopharynx and supraglottic larynx within normal limits. Glottis closed and not well assessed. Subglottic airway clear. Salivary glands: Salivary glands including the parotid and submandibular glands are within normal limits. Thyroid: Thyroid normal. Lymph nodes: No pathologically enlarged cervical or supraclavicular adenopathy. No adenopathy within the visualized upper mediastinum or axilla. Vascular: Aberrant right subclavian artery noted. Normal intravascular enhancement seen throughout the neck. Minor atherosclerotic change about the right carotid bifurcation. Limited intracranial: Unremarkable. Visualized orbits: Visualized globes and orbital soft tissues within normal limits. Mastoids and visualized paranasal sinuses: Visualized paranasal sinuses are clear. Visualize mastoids and middle ear cavities are well pneumatized and free of  fluid. Skeleton: No acute osseous abnormality. No discrete lytic or blastic osseous lesions. Mild cervical spondylolysis present at C6-7. Upper chest: Visualized upper chest demonstrates no acute finding. Partially visualized lungs are clear. Other: Metallic BB marker overlies the lower right anterior neck at palpable  area of concern. A normal appearing subcutaneous vein is present at this location (series 2, image 59). No other discrete mass, collection, adenopathy, or other concerning finding. IMPRESSION: 1. Normal CT of the neck. A small normal appearing subcutaneous vein is seen underlying the palpable area of concern within the right neck. No mass lesion, adenopathy, or other abnormality identified. 2. Aberrant right subclavian artery. Electronically Signed   By: Jeannine Boga M.D.   On: 12/20/2018 01:48    Radiology Ct Soft Tissue Neck W Contrast  Result Date: 12/20/2018 CLINICAL DATA:  Initial evaluation for palpable lump at right neck EXAM: CT NECK WITH CONTRAST TECHNIQUE: Multidetector CT imaging of the neck was performed using the standard protocol following the bolus administration of intravenous contrast. CONTRAST:  11mL OMNIPAQUE IOHEXOL 300 MG/ML  SOLN COMPARISON:  None. FINDINGS: Pharynx and larynx: Oral cavity within normal limits without discrete mass or loculated fluid collection. No acute abnormality about the dentition. Oropharynx and nasopharynx within normal limits. No retropharyngeal collection. Epiglottis normal. Remainder of the hypopharynx and supraglottic larynx within normal limits. Glottis closed and not well assessed. Subglottic airway clear. Salivary glands: Salivary glands including the parotid and submandibular glands are within normal limits. Thyroid: Thyroid normal. Lymph nodes: No pathologically enlarged cervical or supraclavicular adenopathy. No adenopathy within the visualized upper mediastinum or axilla. Vascular: Aberrant right subclavian artery noted. Normal intravascular enhancement seen throughout the neck. Minor atherosclerotic change about the right carotid bifurcation. Limited intracranial: Unremarkable. Visualized orbits: Visualized globes and orbital soft tissues within normal limits. Mastoids and visualized paranasal sinuses: Visualized paranasal sinuses are clear.  Visualize mastoids and middle ear cavities are well pneumatized and free of fluid. Skeleton: No acute osseous abnormality. No discrete lytic or blastic osseous lesions. Mild cervical spondylolysis present at C6-7. Upper chest: Visualized upper chest demonstrates no acute finding. Partially visualized lungs are clear. Other: Metallic BB marker overlies the lower right anterior neck at palpable area of concern. A normal appearing subcutaneous vein is present at this location (series 2, image 59). No other discrete mass, collection, adenopathy, or other concerning finding. IMPRESSION: 1. Normal CT of the neck. A small normal appearing subcutaneous vein is seen underlying the palpable area of concern within the right neck. No mass lesion, adenopathy, or other abnormality identified. 2. Aberrant right subclavian artery. Electronically Signed   By: Jeannine Boga M.D.   On: 12/20/2018 01:48    Procedures Procedures (including critical care time)  Medications Ordered in ED Medications  ketorolac (TORADOL) 30 MG/ML injection 15 mg (15 mg Intravenous Given 12/20/18 0005)  iohexol (OMNIPAQUE) 300 MG/ML solution 75 mL (75 mLs Intravenous Contrast Given 12/20/18 0118)    There is no swelling of the neck.  No LAN. Patient has a pain contract. No signs of infection.  Recommend NSAID.  Can ice the lipoma.      Maria French was evaluated in Emergency Department on 12/20/2018 for the symptoms described in the history of present illness. She was evaluated in the context of the global COVID-19 pandemic, which necessitated consideration that the patient might be at risk for infection with the SARS-CoV-2 virus that causes COVID-19. Institutional protocols and algorithms that pertain to the evaluation of patients at risk for COVID-19  are in a state of rapid change based on information released by regulatory bodies including the CDC and federal and state organizations. These policies and algorithms were followed  during the patient's care in the ED.  Final Clinical Impressions(s) / ED Diagnoses   Final diagnoses:  Neck pain  Lipoma of right upper extremity   Return for intractable cough, coughing up blood,fevers >100.4 unrelieved by medication, shortness of breath, intractable vomiting, chest pain, shortness of breath, weakness,numbness, changes in speech, facial asymmetry,abdominal pain, passing out,Inability to tolerate liquids or food, cough, altered mental status or any concerns. No signs of systemic illness or infection. The patient is nontoxic-appearing on exam and vital signs are within normal limits.   I have reviewed the triage vital signs and the nursing notes. Pertinent labs &imaging results that were available during my care of the patient were reviewed by me and considered in my medical decision making (see chart for details).  After history, exam, and medical workup I feel the patient has been appropriately medically screened and is safe for discharge home. Pertinent diagnoses were discussed with the patient. Patient was given return precautions ED Discharge Orders         Ordered    naproxen (NAPROSYN) 375 MG tablet  2 times daily with meals     12/20/18 0158           Donneisha Beane, MD 12/20/18 [id number]

## 2018-12-20 NOTE — ED Notes (Signed)
Received report

## 2018-12-20 NOTE — ED Notes (Signed)
Removed IV 20G in L antecubital.

## 2019-12-26 IMAGING — CT CT NECK WITH CONTRAST
4 series · 15 of 33 positions shown, 18 images · IV contrast (omnipaque)
Comparison: None.

CLINICAL DATA: Initial evaluation for palpable lump at right neck

EXAM:
CT NECK WITH CONTRAST
TECHNIQUE: Multidetector CT imaging of the neck was performed using the
standard protocol following the bolus administration of intravenous
contrast.
CONTRAST:  75mL OMNIPAQUE IOHEXOL 300 MG/ML  SOLN

[Series 2: axial neck · axial · 0.54mm/px · z∈[-292,-156]mm · 5 of 104 slices shown, 7 images]
[im 18/104  soft-tissue]
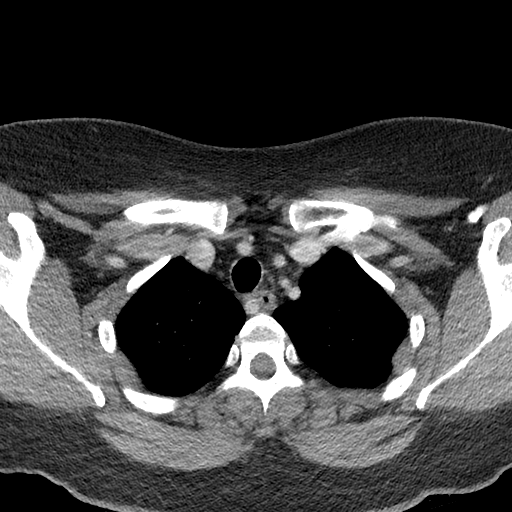
[im 18/104  bone]
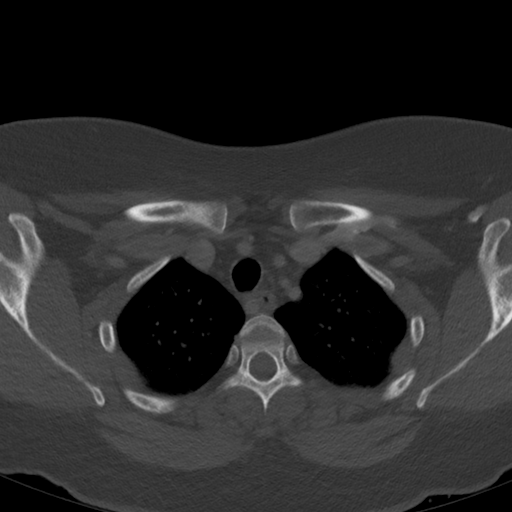
[im 35/104  bone]
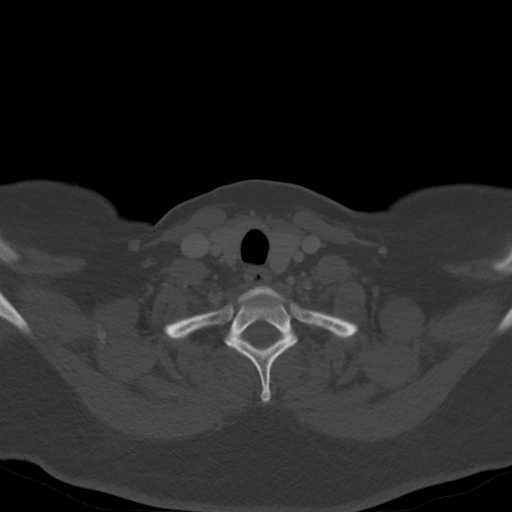
[im 52/104  bone]
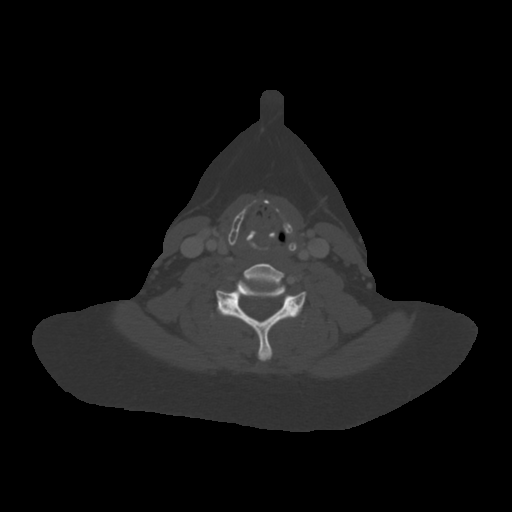
[im 69/104  bone]
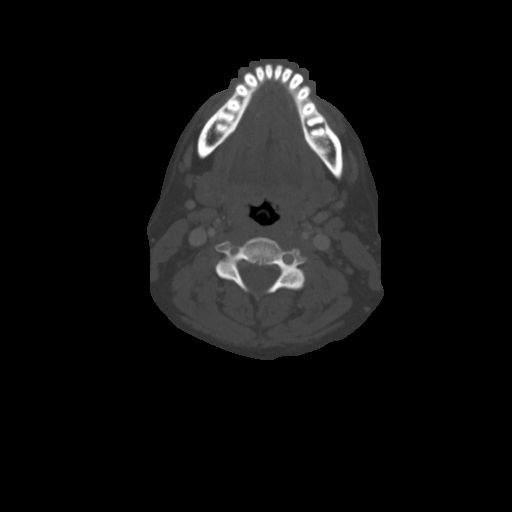
[im 86/104  soft-tissue]
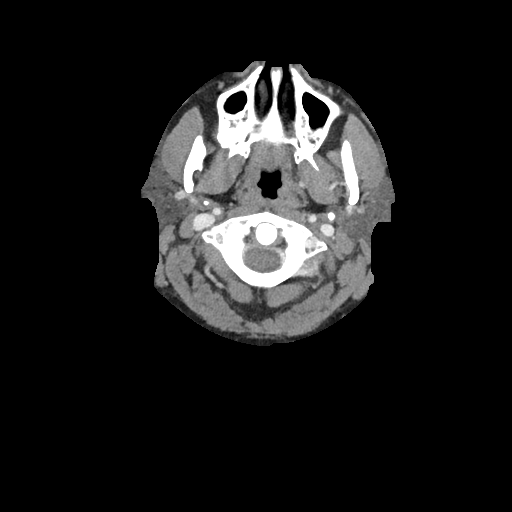
[im 86/104  bone]
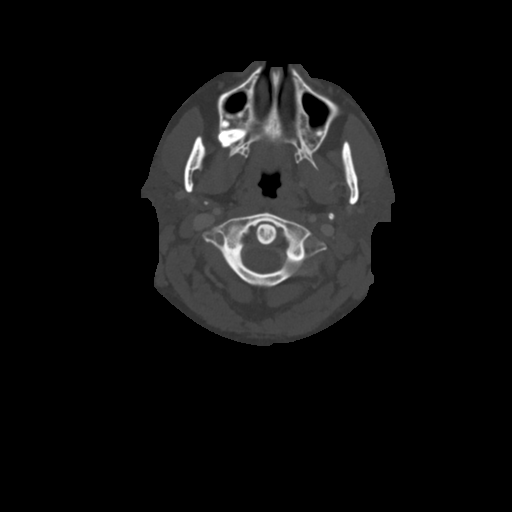

[Series 3: sag neck · sagittal · 0.45mm/px · 5 of 111 slices shown, 6 images]
[im 37/111  bone]
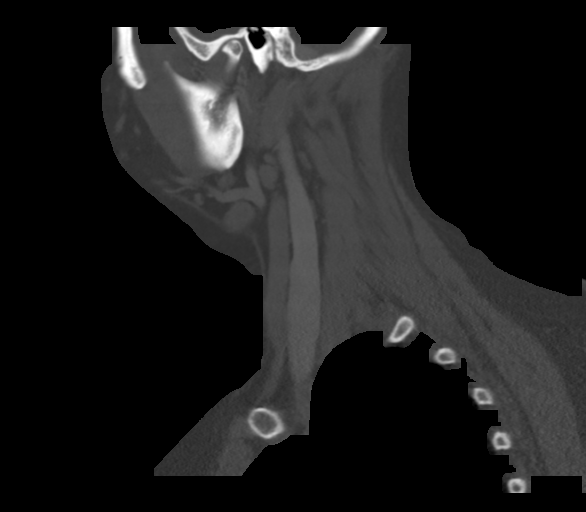
[im 46/111  bone]
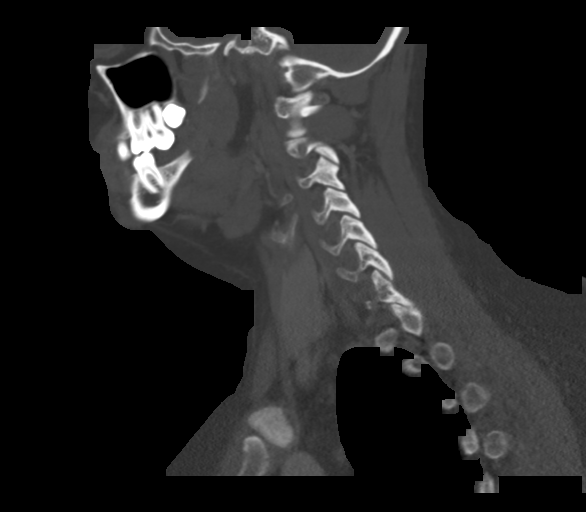
[im 56/111  soft-tissue]
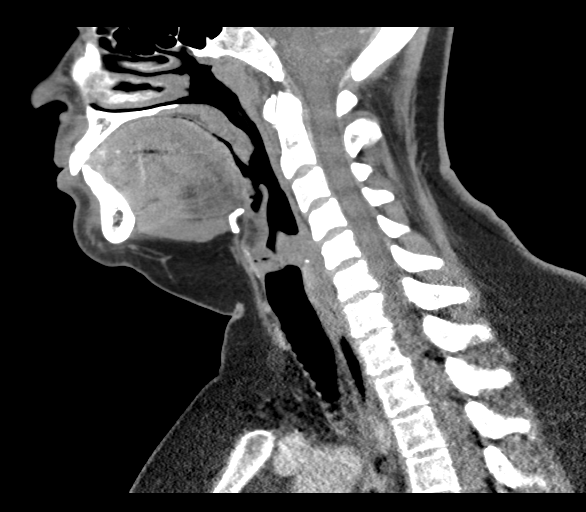
[im 56/111  bone]
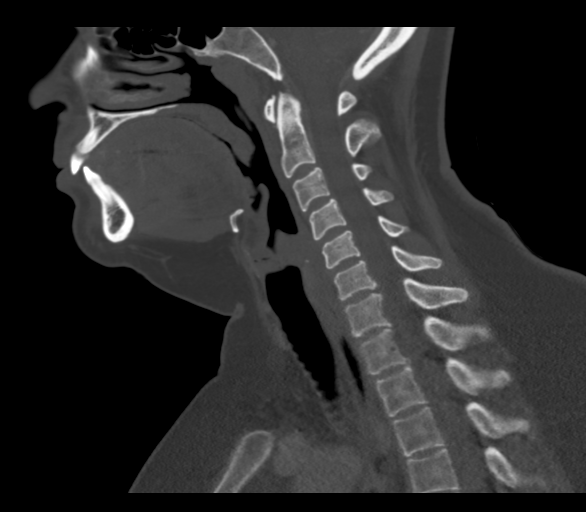
[im 65/111  bone]
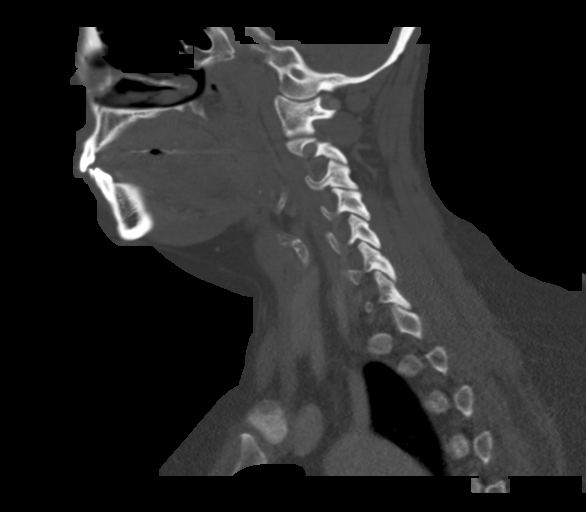
[im 74/111  bone]
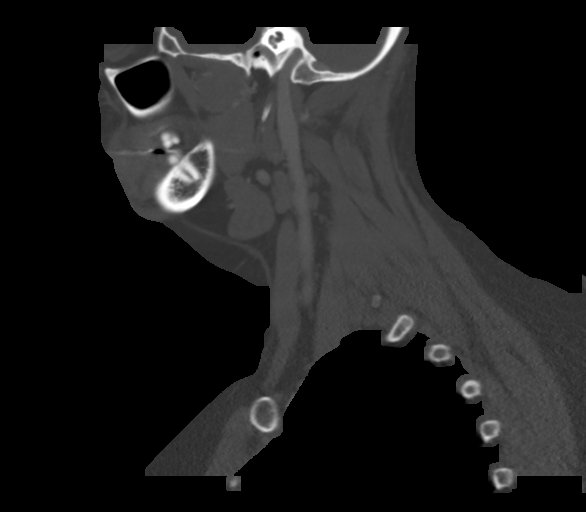

[Series 4: cor neck · coronal · 0.45mm/px · 3 of 107 slices shown]
[im 22/107  bone]
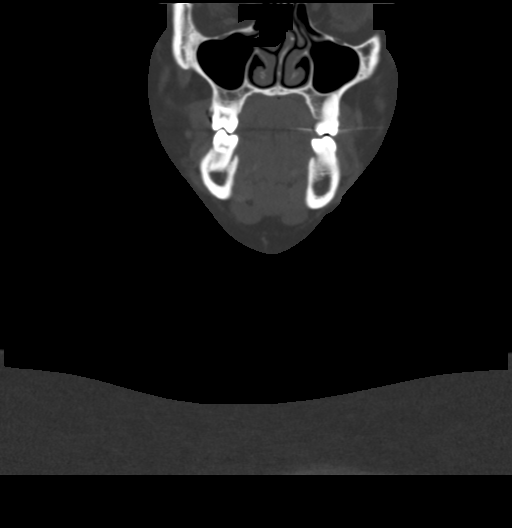
[im 43/107  bone]
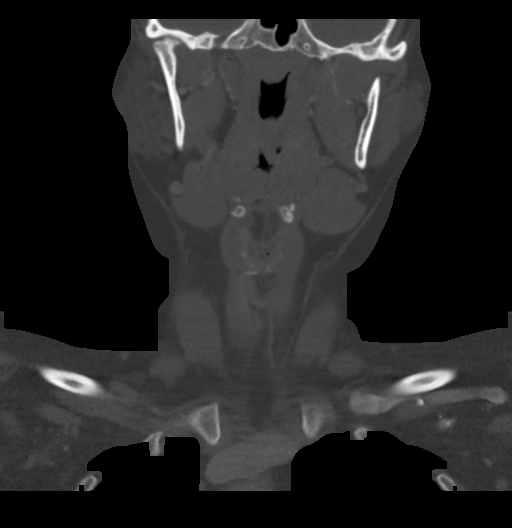
[im 64/107  bone]
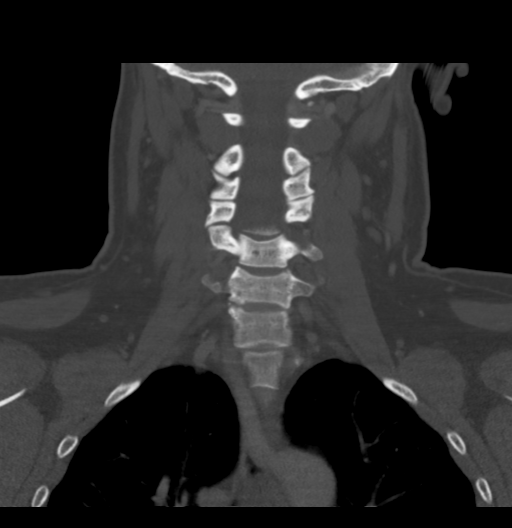

[Series 5: orthogonal ax · axial · 0.39mm/px · z∈[-315,-282]mm · 2 of 103 slices shown]
[im 18/103  bone]
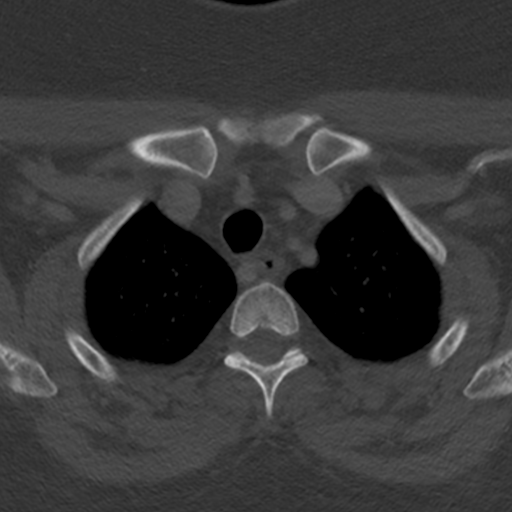
[im 35/103  bone]
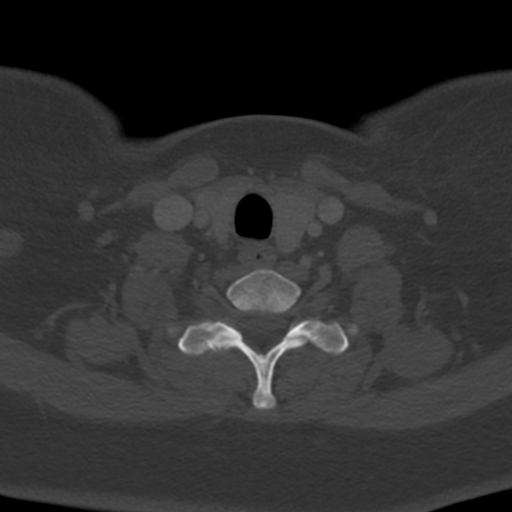

[15 of 33 positions shown; findings below may reference images not displayed]

FINDINGS: Pharynx and larynx: Oral cavity within normal limits without
discrete mass or loculated fluid collection. No acute abnormality
about the dentition. Oropharynx and nasopharynx within normal
limits. No retropharyngeal collection. Epiglottis normal. Remainder
of the hypopharynx and supraglottic larynx within normal limits.
Glottis closed and not well assessed. Subglottic airway clear.

Salivary glands: Salivary glands including the parotid and
submandibular glands are within normal limits.

Thyroid: Thyroid normal.

Lymph nodes: No pathologically enlarged cervical or supraclavicular
adenopathy. No adenopathy within the visualized upper mediastinum or
axilla.

Vascular: Aberrant right subclavian artery noted. Normal
intravascular enhancement seen throughout the neck. Minor
atherosclerotic change about the right carotid bifurcation.

Limited intracranial: Unremarkable.

Visualized orbits: Visualized globes and orbital soft tissues within
normal limits.

Mastoids and visualized paranasal sinuses: Visualized paranasal
sinuses are clear. Visualize mastoids and middle ear cavities are
well pneumatized and free of fluid.

Skeleton: No acute osseous abnormality. No discrete lytic or blastic
osseous lesions. Mild cervical spondylolysis present at C6-7.

Upper chest: Visualized upper chest demonstrates no acute finding.
Partially visualized lungs are clear.

Other: Metallic BB marker overlies the lower right anterior neck at
palpable area of concern. A normal appearing subcutaneous vein is
present at this location (series 2, image 59). No other discrete
mass, collection, adenopathy, or other concerning finding.
IMPRESSION: 1. Normal CT of the neck. A small normal appearing subcutaneous vein
is seen underlying the palpable area of concern within the right
neck. No mass lesion, adenopathy, or other abnormality identified.
2. Aberrant right subclavian artery.

## 2020-04-26 ENCOUNTER — Encounter (HOSPITAL_BASED_OUTPATIENT_CLINIC_OR_DEPARTMENT_OTHER): Payer: Self-pay

## 2020-04-26 ENCOUNTER — Other Ambulatory Visit: Payer: Self-pay

## 2020-04-26 DIAGNOSIS — R109 Unspecified abdominal pain: Secondary | ICD-10-CM | POA: Diagnosis not present

## 2020-04-26 DIAGNOSIS — Z5321 Procedure and treatment not carried out due to patient leaving prior to being seen by health care provider: Secondary | ICD-10-CM | POA: Diagnosis not present

## 2020-04-26 NOTE — ED Triage Notes (Signed)
Pt c/o abd pain x 2 weeks that she feels is r/t to "mesh from a hernia surgery several years ago"-NAD-steady gait

## 2020-04-27 ENCOUNTER — Emergency Department (HOSPITAL_BASED_OUTPATIENT_CLINIC_OR_DEPARTMENT_OTHER)
Admission: EM | Admit: 2020-04-27 | Discharge: 2020-04-27 | Disposition: A | Discharge status: Home / Self Care | Payer: Medicaid Other | Attending: Emergency Medicine | Admitting: Emergency Medicine

## 2021-09-24 ENCOUNTER — Emergency Department (HOSPITAL_BASED_OUTPATIENT_CLINIC_OR_DEPARTMENT_OTHER)
Admission: EM | Admit: 2021-09-24 | Discharge: 2021-09-24 | Disposition: A | Discharge status: Home / Self Care | Payer: Medicaid Other | Attending: Emergency Medicine | Admitting: Emergency Medicine

## 2021-09-24 ENCOUNTER — Other Ambulatory Visit: Payer: Self-pay

## 2021-09-24 ENCOUNTER — Encounter (HOSPITAL_BASED_OUTPATIENT_CLINIC_OR_DEPARTMENT_OTHER): Payer: Self-pay | Admitting: Emergency Medicine

## 2021-09-24 DIAGNOSIS — R220 Localized swelling, mass and lump, head: Secondary | ICD-10-CM | POA: Diagnosis present

## 2021-09-24 DIAGNOSIS — K029 Dental caries, unspecified: Secondary | ICD-10-CM | POA: Diagnosis not present

## 2021-09-24 DIAGNOSIS — K047 Periapical abscess without sinus: Secondary | ICD-10-CM | POA: Diagnosis not present

## 2021-09-24 HISTORY — DX: Other chronic pain: G89.29

## 2021-09-24 HISTORY — DX: Type 2 diabetes mellitus without complications: E11.9

## 2021-09-24 MED ORDER — AMOXICILLIN-POT CLAVULANATE 875-125 MG PO TABS: 1.0000 | ORAL_TABLET | Freq: Two times a day (BID) | ORAL | 0 refills | Status: AC

## 2021-09-24 NOTE — ED Provider Notes (Signed)
[facility] Provider Note   CSN: [id number] Arrival date & time: 09/24/21  1324     History  Chief Complaint  Patient presents with   Abscess    Maria French is a 41 y.o. female.  41 year old female presents today for evaluation of right-sided facial swelling of about 2-day duration.  Denies difficulty swallowing, fever, drainage from this area.  Has a primary dentist.  Denies other complaints.  Has not taken anything prior to arrival for this.  The history is provided by the patient. No language interpreter was used.      Home Medications Prior to Admission medications   Medication Sig Start Date End Date Taking? Authorizing Provider  amoxicillin-clavulanate (AUGMENTIN) 875-125 MG tablet Take 1 tablet by mouth every 12 (twelve) hours. 09/24/21  Yes Stepheni Cameron, PA-C  BELBUCA 600 MCG FILM SMARTSIG:1 Strip(s) By Mouth Every 12 Hours 09/02/21  Yes [provider]  glipiZIDE (GLUCOTROL) 5 MG tablet Take 5 mg by mouth 2 (two) times daily. 09/15/21  Yes [provider]  HYDROcodone-acetaminophen (NORCO) 10-325 MG per tablet Take 1 tablet by mouth every 8 (eight) hours as needed for severe pain. Break through pain   Yes [provider]  HYDROcodone-acetaminophen (NORCO) 10-325 MG tablet Take by mouth. 09/17/21 12/16/21 Yes [provider]  pantoprazole (PROTONIX) 40 MG tablet Take 40 mg by mouth 2 (two) times daily. 08/08/21  Yes [provider]  sertraline (ZOLOFT) 50 MG tablet Take by mouth. 09/15/21  Yes [provider]  benzonatate (TESSALON) 100 MG capsule Take 1 capsule (100 mg total) by mouth every 8 (eight) hours. 04/11/17   Caccavale, Sophia, PA-C  chlorpheniramine-HYDROcodone (TUSSIONEX PENNKINETIC ER) 10-8 MG/5ML SUER Take 5 mLs by mouth at bedtime as needed for cough. 04/11/17   Caccavale, Sophia, PA-C  clonazePAM (KLONOPIN) 0.5 MG tablet Take 0.25 mg by mouth 2 (two) times daily as needed for  anxiety.    [provider]  cyclobenzaprine (FLEXERIL) 10 MG tablet Take 1 tablet (10 mg total) by mouth 3 (three) times daily as needed for muscle spasms. 06/11/13   Kary Kos, MD  DULoxetine (CYMBALTA) 60 MG capsule Take 60 mg by mouth daily.    [provider]  gabapentin (NEURONTIN) 300 MG capsule Take 300 mg by mouth 3 (three) times daily.    [provider]  ibuprofen (ADVIL,MOTRIN) 200 MG tablet Take 800 mg by mouth every 6 (six) hours as needed for mild pain.    [provider]  JANUVIA 50 MG tablet Take 50 mg by mouth daily. 09/15/21   [provider]  morphine (MS CONTIN) 15 MG 12 hr tablet Take 15 mg by mouth every 12 (twelve) hours.    [provider]  naproxen (NAPROSYN) 375 MG tablet Take 1 tablet (375 mg total) by mouth 2 (two) times daily with a meal. 12/20/18   Palumbo, April, MD  omeprazole (PRILOSEC) 20 MG capsule Take 40 mg by mouth daily.    [provider]  oxyCODONE-acetaminophen (PERCOCET) 10-325 MG tablet Take 1 tablet by mouth 2 (two) times daily.    [provider]  phenazopyridine (PYRIDIUM) 95 MG tablet Take 1 tablet (95 mg total) by mouth 3 (three) times daily as needed for pain. 04/27/17   Maczis, Barth Kirks, PA-C  pregabalin (LYRICA) 225 MG capsule Take 225 mg by mouth 2 (two) times daily.    [provider]  traZODone (DESYREL) 50 MG tablet Take 50 mg by mouth  at bedtime.    [provider]      Allergies    Patient has no known allergies.    Review of Systems   Review of Systems  Constitutional:  Negative for chills and fever.  HENT:  Positive for dental problem. Negative for drooling, sore throat, trouble swallowing and voice change.   All other systems reviewed and are negative.  Physical Exam Updated Vital Signs BP 131/82 (BP Location: Right Arm)   Pulse 80   Temp 98.7 F (37.1 C) (Oral)   Resp 18   Ht '5\' 1"'$  (1.549 m)   Wt 72.6 kg   LMP 05/18/2013   SpO2 97%    BMI 30.23 kg/m  Physical Exam Vitals and nursing note reviewed.  Constitutional:      General: She is not in acute distress.    Appearance: Normal appearance. She is not ill-appearing.  HENT:     Head: Normocephalic and atraumatic.     Nose: Nose normal.     Mouth/Throat:     Dentition: Dental caries and dental abscesses present.     Pharynx: Oropharynx is clear. Uvula midline. No oropharyngeal exudate, posterior oropharyngeal erythema or uvula swelling.     Tonsils: No tonsillar exudate or tonsillar abscesses.      Comments: Right-sided facial swelling around the right mandible.  Without evidence of retropharyngeal abscess, peritonsillar abscess, or Ludwick's angina.  Without trismus. Eyes:     Conjunctiva/sclera: Conjunctivae normal.  Cardiovascular:     Rate and Rhythm: Normal rate and regular rhythm.  Pulmonary:     Effort: Pulmonary effort is normal. No respiratory distress.     Breath sounds: Normal breath sounds. No wheezing.  Musculoskeletal:        General: No deformity.  Skin:    Findings: No rash.  Neurological:     Mental Status: She is alert.    ED Results / Procedures / Treatments   Labs (all labs ordered are listed, but only abnormal results are displayed) Labs Reviewed - No data to display  EKG None  Radiology No results found.  Procedures Procedures    Medications Ordered in ED Medications - No data to display  ED Course/ Medical Decision Making/ A&P                           Medical Decision Making Risk Prescription drug management.   41 year old female presents today for evaluation of right-sided facial swelling of 2-day duration.  Without associated fever, difficulty swallowing.  Her exam without evidence of trismus, retropharyngeal abscess, peritonsillar abscess, Ludwick's angina.  We will provide Augmentin.  She does have a primary dentist that she will call tomorrow to follow-up with.  On-call dental information provided.  Return  precautions discussed.  Patient voices understanding and is in agreement with plan.   Final Clinical Impression(s) / ED Diagnoses Final diagnoses:  Dental abscess    Rx / DC Orders ED Discharge Orders          Ordered    amoxicillin-clavulanate (AUGMENTIN) 875-125 MG tablet  Every 12 hours        09/24/21 1410              Evlyn Courier, PA-C 09/24/21 [place], [place], DO 09/24/21 1458

## 2021-09-24 NOTE — ED Triage Notes (Signed)
Swelling to right side of face, jaw area, worse over past couple hrs

## 2021-09-24 NOTE — Discharge Instructions (Addendum)
Likely have a dental abscess.  There is no drainable abscess on exam today.  I have sent antibiotics into the pharmacy for you.  I recommend you call your dentist tomorrow to schedule an appointment to be evaluated.  If you are unable to be seen by your primary dentist.  I have attached on-call dentist information above for you.  Continue taking Tylenol and ibuprofen for pain control.  If you have any significant worsening, or concerning symptoms return to the emergency room for evaluation.
